# Patient Record
Sex: Female | Born: 1955 | Race: White | Hispanic: No | Smoking: Never smoker
Health system: Southern US, Community
[De-identification: ages and names within clinical notes are randomized; demographics above are authoritative.]

## PROBLEM LIST (undated history)

## (undated) DIAGNOSIS — Z87440 Personal history of urinary (tract) infections: Secondary | ICD-10-CM

## (undated) DIAGNOSIS — Z8619 Personal history of other infectious and parasitic diseases: Secondary | ICD-10-CM

## (undated) DIAGNOSIS — Z8489 Family history of other specified conditions: Secondary | ICD-10-CM

## (undated) DIAGNOSIS — K219 Gastro-esophageal reflux disease without esophagitis: Secondary | ICD-10-CM

## (undated) DIAGNOSIS — E039 Hypothyroidism, unspecified: Secondary | ICD-10-CM

## (undated) DIAGNOSIS — T7840XA Allergy, unspecified, initial encounter: Secondary | ICD-10-CM

## (undated) DIAGNOSIS — M1990 Unspecified osteoarthritis, unspecified site: Secondary | ICD-10-CM

## (undated) DIAGNOSIS — J42 Unspecified chronic bronchitis: Secondary | ICD-10-CM

## (undated) DIAGNOSIS — E079 Disorder of thyroid, unspecified: Secondary | ICD-10-CM

## (undated) HISTORY — DX: Unspecified chronic bronchitis: J42

## (undated) HISTORY — PX: WISDOM TOOTH EXTRACTION: SHX21

## (undated) HISTORY — PX: CATARACT EXTRACTION W/ INTRAOCULAR LENS IMPLANT: SHX1309

## (undated) HISTORY — DX: Personal history of other infectious and parasitic diseases: Z86.19

## (undated) HISTORY — DX: Personal history of urinary (tract) infections: Z87.440

## (undated) HISTORY — DX: Disorder of thyroid, unspecified: E07.9

## (undated) HISTORY — PX: TONSILLECTOMY AND ADENOIDECTOMY: SUR1326

## (undated) HISTORY — DX: Allergy, unspecified, initial encounter: T78.40XA

## (undated) HISTORY — DX: Gastro-esophageal reflux disease without esophagitis: K21.9

---

## 2016-09-29 ENCOUNTER — Ambulatory Visit (INDEPENDENT_AMBULATORY_CARE_PROVIDER_SITE_OTHER): Payer: 59 | Admitting: Physician Assistant

## 2016-09-29 ENCOUNTER — Encounter: Payer: Self-pay | Admitting: Physician Assistant

## 2016-09-29 VITALS — BP 137/88 | HR 60 | Temp 98.3°F | Resp 17 | Ht 68.5 in | Wt 234.0 lb

## 2016-09-29 DIAGNOSIS — J4521 Mild intermittent asthma with (acute) exacerbation: Secondary | ICD-10-CM

## 2016-09-29 MED ORDER — ALBUTEROL SULFATE HFA 108 (90 BASE) MCG/ACT IN AERS
2.0000 | INHALATION_SPRAY | Freq: Four times a day (QID) | RESPIRATORY_TRACT | 0 refills | Status: DC | PRN
Start: 2016-09-29 — End: 2017-03-02

## 2016-09-29 MED ORDER — FLUTICASONE PROPIONATE HFA 44 MCG/ACT IN AERO: 2.0000 | INHALATION_SPRAY | Freq: Two times a day (BID) | RESPIRATORY_TRACT | 0 refills | Status: DC

## 2016-09-29 MED ORDER — PREDNISONE 10 MG PO TABS: ORAL_TABLET | ORAL | 0 refills | Status: AC

## 2016-09-29 NOTE — Patient Instructions (Addendum)
     IF you received an x-ray today, you will receive an invoice from [facility]. Please contact [facility] at [phone] with questions or concerns regarding your invoice.   IF you received labwork today, you will receive an invoice from [facility]. Please contact [facility] at [phone] with questions or concerns regarding your invoice.   Our billing staff will not be able to assist you with questions regarding bills from these companies.  You will be contacted with the lab results as soon as they are available. The fastest way to get your results is to activate your My Chart account. Instructions are located on the last page of this paperwork. If you have not heard from us regarding the results in 2 weeks, please contact this office.    We recommend that you schedule a mammogram for breast cancer screening. Typically, you do not need a referral to do this. Please contact a local imaging center to schedule your mammogram.  [facility] - [phone]  *ask for the Radiology Department [facility]) - [phone] or [phone]  [facility] - [phone] [facility] - [phone] [facility] - [phone]  *ask for the Radiology Department [facility] - [phone]  *ask for the Radiology Department [facility] - [phone]  *ask for the Mammography Department [facility] - [phone] 

## 2016-09-29 NOTE — Progress Notes (Signed)
   Michele Chan  MRN: [id number] DOB: [birth year]  PCP: System, Pcp Not In  Chief Complaint  Patient presents with  . Cough    onset 1 month getting worse  . Shortness of Breath    Subjective:  Pt presents to clinic for problems with SOB and wheezing with a cough for the last month - she has problems with seasonal allergies and she has been using OTC allergy medications.  Most seasons she can take care of it with this regimen but this year she has had problems.  She had an albuterol inhaler from the past that has helped but it ran out.  She is leaving in 6 weeks to go to [place] and wants to make sure she is better by the time she is supposed to leave.  She has no sinus congestion with the use of zyrtec - she has a deep cough that is mainly dry - the production is clear if there is any - she has SOB esp with activity and some wheezing.   Review of Systems  Constitutional: Negative for chills and fever.  HENT: Negative for congestion.   Respiratory: Positive for cough, shortness of breath and wheezing.   Gastrointestinal: Negative.   Allergic/Immunologic: Positive for environmental allergies.    There are no active problems to display for this patient.   No current outpatient prescriptions on file prior to visit.   No current facility-administered medications on file prior to visit.     No Known Allergies  Pt patients past, family and social history were reviewed and updated.   Objective:  BP 137/88 (BP Location: Right Arm, Patient Position: Sitting, Cuff Size: Large)   Pulse 60   Temp 98.3 F (36.8 C) (Oral)   Resp 17   Ht 5' 8.5" (1.74 m)   Wt 234 lb (106.1 kg)   SpO2 98%   BMI 35.06 kg/m   Physical Exam  Constitutional: She is oriented to person, place, and time and well-developed, well-nourished, and in no distress.  HENT:  Head: Normocephalic and atraumatic.  Right Ear: Hearing, tympanic membrane, external ear and ear canal normal.  Left Ear: Hearing, tympanic  membrane, external ear and ear canal normal.  Nose: Nose normal.  Mouth/Throat: Uvula is midline, oropharynx is clear and moist and mucous membranes are normal.  Eyes: Conjunctivae are normal.  Neck: Normal range of motion.  Cardiovascular: Normal rate, regular rhythm and normal heart sounds.   No murmur heard. Pulmonary/Chest: Effort normal. No respiratory distress. She has wheezes (all lung fields - expiratory - worse on the bases). She has no rales.  Neurological: She is alert and oriented to person, place, and time. Gait normal.  Skin: Skin is warm and dry.  Psychiatric: Mood, memory, affect and judgment normal.  Vitals reviewed.   Assessment and Plan :  Mild intermittent asthmatic bronchitis with acute exacerbation - Plan: fluticasone (FLOVENT HFA) 44 MCG/ACT inhaler, albuterol (PROVENTIL HFA;VENTOLIN HFA) 108 (90 Base) MCG/ACT inhaler, predniSONE (DELTASONE) 10 MG tablet, Care order/instruction:   Continue zyrtec - short dose steroids with a month if inhaler steroid - rinse mouth after use - f/u if needed  Windell Hummingbird PA-C  [facility] 09/29/2016 11:06 AM

## 2017-02-26 ENCOUNTER — Encounter: Payer: Self-pay | Admitting: Family Medicine

## 2017-02-26 NOTE — Telephone Encounter (Signed)
Spoke with patient regarding symptoms. Patient reports "wheezing and rattling in my chest", with difficulty breathing. Patient has been using Albuterol inhaler, but it is now empty. Advised patient to go to local Urgent Care for evaluation and treatment, patient verbalized understanding. Patient is scheduled for new patient appointment with Dr. Birdie Riddle on 03/09/17.

## 2017-03-02 ENCOUNTER — Other Ambulatory Visit: Payer: Self-pay | Admitting: Physician Assistant

## 2017-03-02 DIAGNOSIS — J4521 Mild intermittent asthma with (acute) exacerbation: Secondary | ICD-10-CM

## 2017-03-03 ENCOUNTER — Emergency Department (HOSPITAL_BASED_OUTPATIENT_CLINIC_OR_DEPARTMENT_OTHER): Payer: 59

## 2017-03-03 ENCOUNTER — Emergency Department (HOSPITAL_BASED_OUTPATIENT_CLINIC_OR_DEPARTMENT_OTHER)
Admission: EM | Admit: 2017-03-03 | Discharge: 2017-03-03 | Disposition: A | Discharge status: Home / Self Care | Payer: 59 | Attending: Emergency Medicine | Admitting: Emergency Medicine

## 2017-03-03 ENCOUNTER — Encounter (HOSPITAL_BASED_OUTPATIENT_CLINIC_OR_DEPARTMENT_OTHER): Payer: Self-pay | Admitting: Emergency Medicine

## 2017-03-03 DIAGNOSIS — J209 Acute bronchitis, unspecified: Secondary | ICD-10-CM | POA: Diagnosis not present

## 2017-03-03 DIAGNOSIS — R0602 Shortness of breath: Secondary | ICD-10-CM | POA: Diagnosis not present

## 2017-03-03 DIAGNOSIS — Z79899 Other long term (current) drug therapy: Secondary | ICD-10-CM | POA: Insufficient documentation

## 2017-03-03 DIAGNOSIS — R05 Cough: Secondary | ICD-10-CM | POA: Diagnosis present

## 2017-03-03 MED ORDER — PREDNISONE 50 MG PO TABS
60.0000 mg | ORAL_TABLET | Freq: Once | ORAL | Status: AC
  Administered 2017-03-03: 22:00:00 60 mg via ORAL
  Filled 2017-03-03: qty 1

## 2017-03-03 MED ORDER — IPRATROPIUM BROMIDE 0.02 % IN SOLN
0.5000 mg | Freq: Once | RESPIRATORY_TRACT | Status: AC
  Administered 2017-03-03: 0.5 mg via RESPIRATORY_TRACT
  Filled 2017-03-03: qty 2.5

## 2017-03-03 MED ORDER — ALBUTEROL SULFATE (2.5 MG/3ML) 0.083% IN NEBU
5.0000 mg | INHALATION_SOLUTION | Freq: Once | RESPIRATORY_TRACT | Status: AC
  Administered 2017-03-03: 5 mg via RESPIRATORY_TRACT
  Filled 2017-03-03: qty 6

## 2017-03-03 MED ORDER — PREDNISONE 20 MG PO TABS: ORAL_TABLET | ORAL | 0 refills | Status: DC

## 2017-03-03 MED ORDER — ALBUTEROL SULFATE (2.5 MG/3ML) 0.083% IN NEBU
2.5000 mg | INHALATION_SOLUTION | Freq: Once | RESPIRATORY_TRACT | Status: AC
  Administered 2017-03-03: 2.5 mg via RESPIRATORY_TRACT
  Filled 2017-03-03: qty 3

## 2017-03-03 MED ORDER — ALBUTEROL SULFATE HFA 108 (90 BASE) MCG/ACT IN AERS
2.0000 | INHALATION_SPRAY | Freq: Once | RESPIRATORY_TRACT | Status: AC
  Administered 2017-03-03: 2 via RESPIRATORY_TRACT
  Filled 2017-03-03: qty 6.7

## 2017-03-03 MED ORDER — IPRATROPIUM-ALBUTEROL 0.5-2.5 (3) MG/3ML IN SOLN
3.0000 mL | Freq: Once | RESPIRATORY_TRACT | Status: AC
Start: 2017-03-03 — End: 2017-03-03
  Administered 2017-03-03: 3 mL via RESPIRATORY_TRACT
  Filled 2017-03-03: qty 3

## 2017-03-03 NOTE — ED Provider Notes (Signed)
[facility] DEPT [facility] Provider Note   CSN: [id number] Arrival date & time: 03/03/17  2426     History   Chief Complaint Chief Complaint  Patient presents with  . Cough    HPI Michele Chan is a 61 y.o. female.  HPI  61 year old female presents with cough, shortness of breath, and chest tightness. She's been having cough for at least 2 weeks. She's been having wheezing and cough and shortness of breath on and off since May. This is a new thing for her. No prior history of smoking or asthma. She does have a history of allergies. Tonight at dinner the cough and short of breath seemed to be rapidly worsening. There was audible wheezing. She has albuterol inhaler but is almost out. The chest tightness has improved after being given 2 breathing treatments by respiratory prior to be seeing her. No fevers. No leg swelling.  Past Medical History:  Diagnosis Date  . Allergy   . GERD (gastroesophageal reflux disease)     There are no active problems to display for this patient.   History reviewed. No pertinent surgical history.  OB History    No data available       Home Medications    Prior to Admission medications   Medication Sig Start Date End Date Taking? Authorizing Provider  albuterol (PROVENTIL HFA;VENTOLIN HFA) 108 (90 Base) MCG/ACT inhaler Inhale 2 puffs into the lungs every 6 (six) hours as needed for wheezing or shortness of breath. 09/29/16   Weber, Damaris Hippo, PA-C  cetirizine (ZYRTEC) 10 MG tablet Take 10 mg by mouth daily.    [provider]  fluticasone (FLOVENT HFA) 44 MCG/ACT inhaler Inhale 2 puffs into the lungs 2 (two) times daily. 09/29/16   Weber, Damaris Hippo, PA-C  omeprazole (PRILOSEC) 10 MG capsule Take 10 mg by mouth daily.    [provider]  predniSONE (DELTASONE) 20 MG tablet 2 tabs po daily x 4 days 03/03/17   Sherwood Gambler, MD    Family History Family History  Problem Relation Age of Onset  . Cancer Mother   . Hypertension  Mother   . Hyperlipidemia Brother   . Cancer Maternal Grandmother   . Alzheimer's disease Paternal Grandmother   . Cancer Paternal Grandfather   . Heart disease Paternal Grandfather   . Hyperlipidemia Paternal Grandfather     Social History Social History  Substance Use Topics  . Smoking status: Never Smoker  . Smokeless tobacco: Never Used  . Alcohol use 1.8 oz/week    3 Standard drinks or equivalent per week     Allergies   Patient has no known allergies.   Review of Systems Review of Systems  Constitutional: Negative for fever.  Respiratory: Positive for cough, chest tightness, shortness of breath and wheezing.   Cardiovascular: Negative for chest pain and leg swelling.  All other systems reviewed and are negative.    Physical Exam Updated Vital Signs BP (!) 120/57   Pulse 88   Temp 98.3 F (36.8 C) (Oral)   Resp (!) 22   Ht 5\' 8"  (1.727 m)   Wt 106.1 kg (234 lb)   SpO2 96%   BMI 35.58 kg/m   Physical Exam  Constitutional: She is oriented to person, place, and time. She appears well-developed and well-nourished. No distress.  HENT:  Head: Normocephalic and atraumatic.  Right Ear: External ear normal.  Left Ear: External ear normal.  Nose: Nose normal.  Eyes: Right eye exhibits no discharge. Left eye  exhibits no discharge.  Cardiovascular: Normal rate, regular rhythm and normal heart sounds.   Pulmonary/Chest: Effort normal. No tachypnea. No respiratory distress. She has wheezes (diffuse, expiratory). She exhibits no tenderness.  Abdominal: Soft. There is no tenderness.  Musculoskeletal: She exhibits no edema.  Neurological: She is alert and oriented to person, place, and time.  Skin: Skin is warm and dry. She is not diaphoretic.  Nursing note and vitals reviewed.    ED Treatments / Results  Labs (all labs ordered are listed, but only abnormal results are displayed) Labs Reviewed - No data to display  EKG  EKG  Interpretation  Date/Time:  Saturday March 03 2017 19:30:12 EDT Ventricular Rate:  80 PR Interval:    QRS Duration: 68 QT Interval:  358 QTC Calculation: 413 R Axis:   62 Text Interpretation:  Sinus rhythm Nonspecific T abnrm, anterolateral leads No old tracing to compare Confirmed by Sherwood Gambler [phone]) on 03/03/2017 8:27:54 PM       Radiology Dg Chest 2 View  Result Date: 03/03/2017 CLINICAL DATA:  Substernal pressure.  Cough for 1 week. EXAM: CHEST  2 VIEW COMPARISON:  None. FINDINGS: The lungs are clear. The pulmonary vasculature is normal. Heart size is normal. Hilar and mediastinal contours are unremarkable. There is no pleural effusion. IMPRESSION: No active cardiopulmonary disease. Electronically Signed   By: Andreas Newport M.D.   On: 03/03/2017 21:43    Procedures Procedures (including critical care time)  Medications Ordered in ED Medications  albuterol (PROVENTIL) (2.5 MG/3ML) 0.083% nebulizer solution 2.5 mg (2.5 mg Nebulization Given 03/03/17 1934)  ipratropium-albuterol (DUONEB) 0.5-2.5 (3) MG/3ML nebulizer solution 3 mL (3 mLs Nebulization Given 03/03/17 1934)  albuterol (PROVENTIL) (2.5 MG/3ML) 0.083% nebulizer solution 2.5 mg (2.5 mg Nebulization Given 03/03/17 2040)  albuterol (PROVENTIL) (2.5 MG/3ML) 0.083% nebulizer solution 5 mg (5 mg Nebulization Given 03/03/17 2127)  ipratropium (ATROVENT) nebulizer solution 0.5 mg (0.5 mg Nebulization Given 03/03/17 2127)  predniSONE (DELTASONE) tablet 60 mg (60 mg Oral Given 03/03/17 2213)  albuterol (PROVENTIL HFA;VENTOLIN HFA) 108 (90 Base) MCG/ACT inhaler 2 puff (2 puffs Inhalation Given 03/03/17 2229)     Initial Impression / Assessment and Plan / ED Course  I have reviewed the triage vital signs and the nursing notes.  Pertinent labs & imaging results that were available during my care of the patient were reviewed by me and considered in my medical decision making (see chart for details).     Patient  wheezing and shortness of breath has improved. I think her chest tightness is related to the wheezing and highly doubt ACS, PE, dissection. Unclear why she is having wheezing and these shortness breath symptoms for several months now. I have recommended she follow-up with pulmonology. Given this appears to be a subacute/chronic problem I will treat her with steroids for her worsening symptoms today. She was given an albuterol inhaler. Discussed strict return precautions.  Final Clinical Impressions(s) / ED Diagnoses   Final diagnoses:  Acute bronchitis, unspecified organism    New Prescriptions Discharge Medication List as of 03/03/2017 10:27 PM    START taking these medications   Details  predniSONE (DELTASONE) 20 MG tablet 2 tabs po daily x 4 days, Print         Sherwood Gambler, MD 03/04/17 0011

## 2017-03-03 NOTE — ED Notes (Signed)
Pt given d/c instructions as per chart. Rx x 1. Verbalizes understanding. No questions. 

## 2017-03-03 NOTE — ED Triage Notes (Signed)
Patient has had a course cough for a few days. PAtient was at dinner tonight and started to cough so back that she couldn't breath at all.

## 2017-03-09 ENCOUNTER — Ambulatory Visit (INDEPENDENT_AMBULATORY_CARE_PROVIDER_SITE_OTHER): Payer: 59 | Admitting: Family Medicine

## 2017-03-09 ENCOUNTER — Encounter: Payer: Self-pay | Admitting: Family Medicine

## 2017-03-09 VITALS — BP 126/84 | HR 79 | Temp 98.1°F | Resp 18 | Ht 68.0 in | Wt 227.1 lb

## 2017-03-09 DIAGNOSIS — R05 Cough: Secondary | ICD-10-CM | POA: Diagnosis not present

## 2017-03-09 DIAGNOSIS — R053 Chronic cough: Secondary | ICD-10-CM

## 2017-03-09 MED ORDER — IPRATROPIUM-ALBUTEROL 0.5-2.5 (3) MG/3ML IN SOLN
3.0000 mL | Freq: Once | RESPIRATORY_TRACT | Status: AC
  Administered 2017-03-09: 3 mL via RESPIRATORY_TRACT

## 2017-03-09 MED ORDER — BUDESONIDE-FORMOTEROL FUMARATE 80-4.5 MCG/ACT IN AERO: 2.0000 | INHALATION_SPRAY | Freq: Two times a day (BID) | RESPIRATORY_TRACT | 3 refills | Status: DC

## 2017-03-09 MED ORDER — ALBUTEROL SULFATE HFA 108 (90 BASE) MCG/ACT IN AERS: 2.0000 | INHALATION_SPRAY | RESPIRATORY_TRACT | 3 refills | Status: DC | PRN

## 2017-03-09 NOTE — Assessment & Plan Note (Signed)
New to provider, ongoing for pt.  She was treated for acute asthmatic bronchitis in May but prior to that, she had not required an inhaler for 'years'.  But since, she has struggled w/ chronic cough and SOB, ending up in ER on 10/13.  Today, she has wheezes and deep hacking cough- both improved s/p DuoNeb.  Start Symbicort twice daily, albuterol prn and refer to pulmonary for complete assessment.  Recent CXR reviewed and WNL.  Reviewed supportive care and red flags that should prompt return.  Pt expressed understanding and is in agreement w/ plan.

## 2017-03-09 NOTE — Addendum Note (Signed)
Addended by: Davis Gourd on: 03/09/2017 03:18 PM   Modules accepted: Orders

## 2017-03-09 NOTE — Patient Instructions (Signed)
Schedule your complete physical in 3 months We'll call you with your pulmonary appt Start the Symbicort- 2 puffs twice daily- every day! Continue to use the Albuterol as needed for cough or shortness of breath Call with any questions or concerns Hang in there!  We'll get this better!!!

## 2017-03-09 NOTE — Progress Notes (Signed)
   Subjective:    Patient ID: Michele Chan, female    DOB: Nov 29, 1955, 61 y.o.   MRN: [id number]  HPI New to establish.  Previous provider- none recently (over 6 yrs)  Cough- pt reports she has had a cough since Feb (was seen at [facility] in May and dx'd w/ Bronchitis) She was treated w/ steroids, albuterol, abx and things improved.  Ended up in ER on 10/13 w/ cough and shortness of breath.  Pt has hx of childhood asthma, no personal smoking hx but father was heavy smoker.  Pt was sent home from ER w/ Proventil inhaler w/ a spacer and is requiring this multiple times daily- 'particularly at night'.     Review of Systems For ROS see HPI     Objective:   Physical Exam  Constitutional: She is oriented to person, place, and time. She appears well-developed and well-nourished. No distress.  HENT:  Head: Normocephalic and atraumatic.  TMs normal bilaterally Mild nasal congestion Throat w/out erythema, edema, or exudate  Eyes: Pupils are equal, round, and reactive to light. Conjunctivae and EOM are normal.  Neck: Normal range of motion. Neck supple.  Cardiovascular: Normal rate, regular rhythm, normal heart sounds and intact distal pulses.   No murmur heard. Pulmonary/Chest: Effort normal. No respiratory distress. She has wheezes (scattered expiratory wheezes- improved s/p neb tx).  + hacking cough  Lymphadenopathy:    She has no cervical adenopathy.  Neurological: She is alert and oriented to person, place, and time.  Skin: Skin is warm and dry.  Psychiatric: She has a normal mood and affect. Her behavior is normal. Thought content normal.  Vitals reviewed.         Assessment & Plan:

## 2017-04-24 ENCOUNTER — Encounter: Payer: Self-pay | Admitting: Internal Medicine

## 2017-04-24 ENCOUNTER — Other Ambulatory Visit (INDEPENDENT_AMBULATORY_CARE_PROVIDER_SITE_OTHER): Payer: 59

## 2017-04-24 ENCOUNTER — Ambulatory Visit (INDEPENDENT_AMBULATORY_CARE_PROVIDER_SITE_OTHER): Payer: 59 | Admitting: Internal Medicine

## 2017-04-24 VITALS — BP 120/74 | HR 60 | Ht 69.0 in | Wt 226.6 lb

## 2017-04-24 DIAGNOSIS — J4540 Moderate persistent asthma, uncomplicated: Secondary | ICD-10-CM

## 2017-04-24 DIAGNOSIS — Z23 Encounter for immunization: Secondary | ICD-10-CM | POA: Diagnosis not present

## 2017-04-24 LAB — CBC WITH DIFFERENTIAL/PLATELET
BASOS ABS: 0.1 10*3/uL (ref 0.0–0.1)
Basophils Relative: 0.9 % (ref 0.0–3.0)
Eosinophils Absolute: 0.5 10*3/uL (ref 0.0–0.7)
Eosinophils Relative: 6.2 % — ABNORMAL HIGH (ref 0.0–5.0)
HEMATOCRIT: 41.5 % (ref 36.0–46.0)
HEMOGLOBIN: 14 g/dL (ref 12.0–15.0)
LYMPHS PCT: 33.4 % (ref 12.0–46.0)
Lymphs Abs: 2.6 10*3/uL (ref 0.7–4.0)
MCHC: 33.8 g/dL (ref 30.0–36.0)
MCV: 92.3 fl (ref 78.0–100.0)
MONOS PCT: 8.4 % (ref 3.0–12.0)
Monocytes Absolute: 0.7 10*3/uL (ref 0.1–1.0)
NEUTROS ABS: 4 10*3/uL (ref 1.4–7.7)
Neutrophils Relative %: 51.1 % (ref 43.0–77.0)
PLATELETS: 233 10*3/uL (ref 150.0–400.0)
RBC: 4.5 Mil/uL (ref 3.87–5.11)
RDW: 13.6 % (ref 11.5–15.5)
WBC: 7.9 10*3/uL (ref 4.0–10.5)

## 2017-04-24 LAB — NITRIC OXIDE: Nitric Oxide: 35

## 2017-04-24 NOTE — Patient Instructions (Signed)
ICD-10-CM   1. Asthma, well controlled, moderate persistent J45.40     Diagnosis - high pre test probability is asthma  Plan  - continue symbicort 2 puf twice daily ; rinse mouth always - albuterol as needed - check IgE, cbc with diff and blood allergy panel 04/24/2017 - flu shot 04/24/2017 - Pneumovax vaccine after few weeks anytime - can do next visit too - do spirometry with bronchodilator and dlco in 3 months  Followup  - in 3 months with Dr Chase Caller but after spirometry

## 2017-04-24 NOTE — Progress Notes (Signed)
Subjective:    Patient ID: Michele Chan, female    DOB: [birth year], 61 y.o.   MRN: [id number]  PCP Midge Minium, MD  HPI  IOV 04/24/2017   Chief Complaint  Patient presents with  . Advice Only    Referred by Dr. Urbano Heir. States that she had bad congestion over the spring and was put on a steroid inhaler which helped. Went on a trip and congestion came back, ended up in ER late September 2018 due to unable to breathe and was put on prednisone. Pt was put on Symbicort which helped with symptoms.    61 year old female.  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  She has been referred for presumed asthma based on the fact she is on Symbicort.  According to her history every year she gets winter bronchitis and she has mild seasonal fall allergies but otherwise she has been healthy.  In spring 2018 started having chest congestion and wheezing and was given empiric inhaled steroid for a month and she began to feel completely better.  This was then stopped.  She then went on a trip to [place] for 30 days and was doing well but upon return of her symptoms started slowly coming back and started deteriorating so much so that in fall 2018 she ended up in the emergency room for significant exacerbation of wheezing.  She was given prednisone that helped significantly.  She followed up with her primary care physician and was placed on Symbicort and since then she has had dramatic improvement in her symptoms.  She did have a chest x-ray in the emergency room that I personally visualized and is clear.   Currently on Symbicort: feno 35 ppbb - on symbicort (improved symptoms) and is in the gray zone.  Asthma control questionnaire shows that she is not waking up in the middle of the night because of any symptoms.  When she wakes up she has very mild symptoms that resolved with Symbicort.  She is not limited in her activities because of her symptoms.  She does experience very little shortness of breath  and wheezing early in the morning that resolves with Symbicort.  She is not using albuterol for rescue.  Asthma control five-point questionnaire is 0.6 showing good control.  Asthma relevant history includes mom with asthma.  She only has occasional mold or mildew exposure while working as a Cabin crew.  There is no cockroach exposure or cat exposure.  Her house is a wooden floor.  She is not a smoker.  She is not on ACE inhibitors.  She has not had a flu shot this season yet   Asthma Control Panel 04/24/2017   Current Med Regimen symbicort dose not known  ACQ 5 point- 1 week. wtd avg score. <1.0 is good control 0.75-1.25 is grey zone. >1.25 poor control. Delta 0.5 is clinically meaningful   FeNO ppB 35 ppb  FeV1    Planned intervention  for visit Symbicort, blood work       has a past medical history of Allergy, Chronic bronchitis ([place]), GERD (gastroesophageal reflux disease), History of shingles, and History of UTI.   reports that  has never smoked. she has never used smokeless tobacco.  Past Surgical History:  Procedure Laterality Date  . TONSILLECTOMY AND ADENOIDECTOMY      No Known Allergies   There is no immunization history on file for this patient.  Family History  Problem Relation Age of Onset  . Cancer Mother   .  Hypertension Mother   . Asthma Mother   . Miscarriages / [place] Mother   . Emphysema Father   . Early death Father   . Hyperlipidemia Brother   . Cancer Maternal Grandmother   . Alzheimer's disease Paternal Grandmother   . Cancer Paternal Grandfather   . Heart disease Paternal Grandfather   . Hyperlipidemia Paternal Grandfather   . Diabetes Paternal Grandfather      Current Outpatient Medications:  .  albuterol (PROAIR HFA) 108 (90 Base) MCG/ACT inhaler, Inhale 2 puffs into the lungs every 4 (four) hours as needed for wheezing or shortness of breath., Disp: 1 Inhaler, Rfl: 3 .  budesonide-formoterol (SYMBICORT) 80-4.5 MCG/ACT inhaler, Inhale 2  puffs into the lungs 2 (two) times daily., Disp: 1 Inhaler, Rfl: 3 .  cetirizine (ZYRTEC) 10 MG tablet, Take 10 mg by mouth daily., Disp: , Rfl:  .  Ibuprofen (ADVIL PO), Take by mouth., Disp: , Rfl:  .  omeprazole (PRILOSEC) 10 MG capsule, Take 10 mg by mouth daily., Disp: , Rfl:    Review of Systems  Constitutional: Negative for fever and unexpected weight change.  HENT: Positive for postnasal drip and sneezing. Negative for congestion, dental problem, ear pain, nosebleeds, rhinorrhea, sinus pressure, sore throat and trouble swallowing.   Eyes: Negative for redness and itching.  Respiratory: Positive for cough, shortness of breath and wheezing. Negative for chest tightness.   Cardiovascular: Negative for palpitations and leg swelling.  Gastrointestinal: Negative for nausea and vomiting.  Genitourinary: Negative for dysuria.  Musculoskeletal: Negative for joint swelling.  Skin: Negative for rash.  Allergic/Immunologic: Positive for environmental allergies. Negative for food allergies and immunocompromised state.  Neurological: Negative for headaches.  Hematological: Does not bruise/bleed easily.  Psychiatric/Behavioral: Negative for dysphoric mood. The patient is not nervous/anxious.        Objective:   Physical Exam  Constitutional: She is oriented to person, place, and time. She appears well-developed and well-nourished. No distress.  HENT:  Head: Normocephalic and atraumatic.  Right Ear: External ear normal.  Left Ear: External ear normal.  Mouth/Throat: Oropharynx is clear and moist. No oropharyngeal exudate.  Eyes: Conjunctivae and EOM are normal. Pupils are equal, round, and reactive to light. Right eye exhibits no discharge. Left eye exhibits no discharge. No scleral icterus.  Neck: Normal range of motion. Neck supple. No JVD present. No tracheal deviation present. No thyromegaly present.  Cardiovascular: Normal rate, regular rhythm, normal heart sounds and intact distal  pulses. Exam reveals no gallop and no friction rub.  No murmur heard. Pulmonary/Chest: Effort normal and breath sounds normal. No respiratory distress. She has no wheezes. She has no rales. She exhibits no tenderness.  Abdominal: Soft. Bowel sounds are normal. She exhibits no distension and no mass. There is no tenderness. There is no rebound and no guarding.  Musculoskeletal: Normal range of motion. She exhibits no edema or tenderness.  Lymphadenopathy:    She has no cervical adenopathy.  Neurological: She is alert and oriented to person, place, and time. She has normal reflexes. No cranial nerve deficit. She exhibits normal muscle tone. Coordination normal.  Skin: Skin is warm and dry. No rash noted. She is not diaphoretic. No erythema. No pallor.  Psychiatric: She has a normal mood and affect. Her behavior is normal. Judgment and thought content normal.  Vitals reviewed.   Vitals:   04/24/17 1000  BP: 120/74  Pulse: 60  SpO2: 98%  Weight: 226 lb 9.6 oz (102.8 kg)  Height: 5\' 9"  (1.753  m)    Estimated body mass index is 33.46 kg/m as calculated from the following:   Height as of this encounter: 5\' 9"  (1.753 m).   Weight as of this encounter: 226 lb 9.6 oz (102.8 kg).       Assessment & Plan:     ICD-10-CM   1. Asthma, well controlled, moderate persistent J45.40     Diagnosis - high pre test probability is asthma  Plan  - continue symbicort 2 puf twice daily ; rinse mouth always - discussed long term need - albuterol as needed - check IgE, cbc with diff and blood allergy panel 04/24/2017 - affirm allergy role and future decisions on biologic x - flu shot 04/24/2017 - Pneumovax vaccine after few weeks anytime - can do next visit too - do spirometry with bronchodilator and dlco in 3 months  Followup  - in 3 months with Dr Chase Caller but after spirometry    Dr. Brand Males, M.D., [facility].C.P Pulmonary and Critical Care Medicine Staff Physician, [place] Director - Interstitial Lung Disease  Program  Pulmonary [place] at [place], [place], [address]  Pager: [phone], If no answer or between  15:00h - 7:00h: call [phone] Telephone: [phone]

## 2017-04-25 LAB — RESPIRATORY ALLERGY PROFILE REGION II ~~LOC~~
Allergen, A. alternata, m6: 1.33 kU/L — ABNORMAL HIGH
Allergen, Cottonwood, t14: <0.1 kU/L
Allergen, D pternoyssinus,d7: 0.14 kU/L — ABNORMAL HIGH
Allergen, P. notatum, m1: <0.1 kU/L
Aspergillus fumigatus, m3: <0.1 kU/L
Bermuda Grass: 0.1 kU/L — ABNORMAL HIGH
Box Elder IgE: 0.14 kU/L — ABNORMAL HIGH
CAT DANDER: 0.6 kU/L — AB
CLADOSPORIUM HERBARUM (M2) IGE: <0.1 kU/L
CLASS: 0
CLASS: 0
CLASS: 0
CLASS: 0
CLASS: 0
CLASS: 0
CLASS: 0
CLASS: 0
CLASS: 0
CLASS: 0
CLASS: 0
CLASS: 0
CLASS: 0
CLASS: 1
CLASS: 2
CLASS: 2
COMMON RAGWEED (SHORT) (W1) IGE: 0.94 kU/L — AB
Class: 0
Class: 0
Class: 0
Class: 0
Class: 0
Class: 0
Class: 2
Class: 3
D. farinae: 0.17 kU/L — ABNORMAL HIGH
Dog Dander: 5.37 kU/L — ABNORMAL HIGH
Elm IgE: <0.1 kU/L
IGE (IMMUNOGLOBULIN E), SERUM: 241 kU/L — AB (ref <=114)
Johnson Grass: <0.1 kU/L
Timothy Grass: 1.48 kU/L — ABNORMAL HIGH

## 2017-04-25 LAB — INTERPRETATION:

## 2017-04-26 ENCOUNTER — Telehealth: Payer: Self-pay | Admitting: Internal Medicine

## 2017-04-26 NOTE — Telephone Encounter (Signed)
Let Michele Chan know that eos high, igE high, blood allergy - several positive. Dx fits in well with asthma. For now continue symbicort. She could see an allergist ([facility] - Dr Remus Blake [facility]) if she wants but we can monitor too  Dr. Brand Males, M.D., [facility].C.P Pulmonary and Critical Care Medicine Staff Physician, [place] Director - Interstitial Lung Disease  Program  Pulmonary [place] at [place], [place], [address]  Pager: [phone], If no answer or between  15:00h - 7:00h: call [phone] Telephone: [phone]

## 2017-04-26 NOTE — Telephone Encounter (Signed)
Called pt and advised message from the provider. Pt understood and verbalized understanding. Nothing further is needed.    

## 2017-06-19 ENCOUNTER — Ambulatory Visit (INDEPENDENT_AMBULATORY_CARE_PROVIDER_SITE_OTHER): Payer: 59 | Admitting: Family Medicine

## 2017-06-19 ENCOUNTER — Other Ambulatory Visit: Payer: Self-pay

## 2017-06-19 ENCOUNTER — Encounter: Payer: Self-pay | Admitting: Family Medicine

## 2017-06-19 VITALS — BP 122/76 | HR 63 | Temp 98.1°F | Resp 16 | Ht 69.0 in | Wt 225.0 lb

## 2017-06-19 DIAGNOSIS — Z1239 Encounter for other screening for malignant neoplasm of breast: Secondary | ICD-10-CM

## 2017-06-19 DIAGNOSIS — Z1231 Encounter for screening mammogram for malignant neoplasm of breast: Secondary | ICD-10-CM | POA: Diagnosis not present

## 2017-06-19 DIAGNOSIS — E669 Obesity, unspecified: Secondary | ICD-10-CM

## 2017-06-19 DIAGNOSIS — Z0000 Encounter for general adult medical examination without abnormal findings: Secondary | ICD-10-CM | POA: Diagnosis not present

## 2017-06-19 DIAGNOSIS — Z23 Encounter for immunization: Secondary | ICD-10-CM

## 2017-06-19 LAB — CBC WITH DIFFERENTIAL/PLATELET
BASOS PCT: 0.6 % (ref 0.0–3.0)
Basophils Absolute: 0 10*3/uL (ref 0.0–0.1)
EOS PCT: 5.5 % — AB (ref 0.0–5.0)
Eosinophils Absolute: 0.4 10*3/uL (ref 0.0–0.7)
HEMATOCRIT: 40.1 % (ref 36.0–46.0)
Hemoglobin: 13.5 g/dL (ref 12.0–15.0)
LYMPHS PCT: 29.3 % (ref 12.0–46.0)
Lymphs Abs: 2.1 10*3/uL (ref 0.7–4.0)
MCHC: 33.8 g/dL (ref 30.0–36.0)
MCV: 92.1 fl (ref 78.0–100.0)
MONOS PCT: 6.9 % (ref 3.0–12.0)
Monocytes Absolute: 0.5 10*3/uL (ref 0.1–1.0)
NEUTROS ABS: 4.1 10*3/uL (ref 1.4–7.7)
Neutrophils Relative %: 57.7 % (ref 43.0–77.0)
PLATELETS: 243 10*3/uL (ref 150.0–400.0)
RBC: 4.36 Mil/uL (ref 3.87–5.11)
RDW: 13.8 % (ref 11.5–15.5)
WBC: 7.1 10*3/uL (ref 4.0–10.5)

## 2017-06-19 LAB — LIPID PANEL
Cholesterol: 246 mg/dL — ABNORMAL HIGH (ref 0–200)
HDL: 62.5 mg/dL (ref >39.00)
LDL CALC: 168 mg/dL — AB (ref 0–99)
NONHDL: 183.96
Total CHOL/HDL Ratio: 4
Triglycerides: 80 mg/dL (ref 0.0–149.0)
VLDL: 16 mg/dL (ref 0.0–40.0)

## 2017-06-19 LAB — HEPATIC FUNCTION PANEL
ALT: 16 U/L (ref 0–35)
AST: 16 U/L (ref 0–37)
Albumin: 4.4 g/dL (ref 3.5–5.2)
Alkaline Phosphatase: 84 U/L (ref 39–117)
BILIRUBIN TOTAL: 0.6 mg/dL (ref 0.2–1.2)
Bilirubin, Direct: 0.1 mg/dL (ref 0.0–0.3)
Total Protein: 7.6 g/dL (ref 6.0–8.3)

## 2017-06-19 LAB — BASIC METABOLIC PANEL
BUN: 20 mg/dL (ref 6–23)
CALCIUM: 9.8 mg/dL (ref 8.4–10.5)
CO2: 29 mEq/L (ref 19–32)
Chloride: 100 mEq/L (ref 96–112)
Creatinine, Ser: 0.83 mg/dL (ref 0.40–1.20)
GFR: 74.32 mL/min (ref >60.00)
Glucose, Bld: 90 mg/dL (ref 70–99)
Potassium: 4.2 mEq/L (ref 3.5–5.1)
Sodium: 138 mEq/L (ref 135–145)

## 2017-06-19 LAB — TSH: TSH: 9.76 u[IU]/mL — ABNORMAL HIGH (ref 0.35–4.50)

## 2017-06-19 NOTE — Patient Instructions (Signed)
Follow up in 1 year or as needed We'll notify you of your lab results and make any changes if needed Continue to work on healthy diet and regular exercise- you're doing great! [facility]- [facility]! Complete the cologuard as directed when it arrives Call with any questions or concerns Happy New Year!!!

## 2017-06-19 NOTE — Assessment & Plan Note (Signed)
Pt is now committed to weight loss and got a puppy to help her be more active.  Applauded her efforts.  Will check labs to risk stratify.  Will follow.

## 2017-06-19 NOTE — Progress Notes (Signed)
   Subjective:    Patient ID: Michele Chan, female    DOB: 10-Sep-1955, 62 y.o.   MRN: [id number]  HPI CPE- pt is due for mammo, declines colonoscopy but is open to Cologuard.  Will get Tdap today.  Due for pap smear.  Pt has increased her physical activity by getting a puppy.   Review of Systems Patient reports no vision/ hearing changes, adenopathy,fever, weight change,  persistant/recurrent hoarseness , swallowing issues, chest pain, palpitations, edema, persistant/recurrent cough, hemoptysis, dyspnea (rest/exertional/paroxysmal nocturnal), gastrointestinal bleeding (melena, rectal bleeding), abdominal pain, significant heartburn, bowel changes, GU symptoms (dysuria, hematuria, incontinence), Gyn symptoms (abnormal  bleeding, pain),  syncope, focal weakness, memory loss, numbness & tingling, skin/hair/nail changes, abnormal bruising or bleeding, anxiety, or depression.     Objective:   Physical Exam General Appearance:    Alert, cooperative, no distress, appears stated age  Head:    Normocephalic, without obvious abnormality, atraumatic  Eyes:    PERRL, conjunctiva/corneas clear, EOM's intact, fundi    benign, both eyes  Ears:    Normal TM's and external ear canals, both ears  Nose:   Nares normal, septum midline, mucosa normal, no drainage    or sinus tenderness  Throat:   Lips, mucosa, and tongue normal; teeth and gums normal  Neck:   Supple, symmetrical, trachea midline, no adenopathy;    Thyroid: no enlargement/tenderness/nodules  Back:     Symmetric, no curvature, ROM normal, no CVA tenderness  Lungs:     Clear to auscultation bilaterally, respirations unlabored  Chest Wall:    No tenderness or deformity   Heart:    Regular rate and rhythm, S1 and S2 normal, no murmur, rub   or gallop  Breast Exam:    Deferred to mammo  Abdomen:     Soft, non-tender, bowel sounds active all four quadrants,    no masses, no organomegaly  Genitalia:    Deferred at pt's request  Rectal:      Extremities:   Extremities normal, atraumatic, no cyanosis or edema  Pulses:   2+ and symmetric all extremities  Skin:   Skin color, texture, turgor normal, no rashes or lesions  Lymph nodes:   Cervical, supraclavicular, and axillary nodes normal  Neurologic:   CNII-XII intact, normal strength, sensation and reflexes    throughout          Assessment & Plan:

## 2017-06-19 NOTE — Assessment & Plan Note (Signed)
Pt's PE WNL w/ exception of obesity.  Due for mammo- ordered.  She declines pap at this time.  Tdap given.  Check labs.  Anticipatory guidance provided.

## 2017-06-20 ENCOUNTER — Other Ambulatory Visit: Payer: Self-pay | Admitting: General Practice

## 2017-06-20 ENCOUNTER — Encounter: Payer: Self-pay | Admitting: General Practice

## 2017-06-20 DIAGNOSIS — E039 Hypothyroidism, unspecified: Secondary | ICD-10-CM

## 2017-06-20 MED ORDER — LEVOTHYROXINE SODIUM 75 MCG PO TABS: 75.0000 ug | ORAL_TABLET | Freq: Every day | ORAL | 3 refills | Status: DC

## 2017-07-03 LAB — COLOGUARD: Cologuard: POSITIVE

## 2017-07-10 ENCOUNTER — Ambulatory Visit
Admission: RE | Admit: 2017-07-10 | Discharge: 2017-07-10 | Disposition: A | Discharge status: Home / Self Care | Payer: 59 | Source: Ambulatory Visit | Attending: Family Medicine | Admitting: Family Medicine

## 2017-07-10 DIAGNOSIS — Z1239 Encounter for other screening for malignant neoplasm of breast: Secondary | ICD-10-CM

## 2017-07-11 ENCOUNTER — Other Ambulatory Visit: Payer: Self-pay | Admitting: Family Medicine

## 2017-07-11 DIAGNOSIS — R928 Other abnormal and inconclusive findings on diagnostic imaging of breast: Secondary | ICD-10-CM

## 2017-07-13 ENCOUNTER — Other Ambulatory Visit: Payer: Self-pay | Admitting: Family Medicine

## 2017-07-13 ENCOUNTER — Telehealth: Payer: Self-pay | Admitting: General Practice

## 2017-07-13 ENCOUNTER — Encounter: Payer: Self-pay | Admitting: Gastroenterology

## 2017-07-13 DIAGNOSIS — R195 Other fecal abnormalities: Secondary | ICD-10-CM

## 2017-07-13 NOTE — Telephone Encounter (Signed)
Office received positive cologuard. Pt informed and referral to GI placed per PCP.

## 2017-07-16 ENCOUNTER — Other Ambulatory Visit (INDEPENDENT_AMBULATORY_CARE_PROVIDER_SITE_OTHER): Payer: 59

## 2017-07-16 ENCOUNTER — Ambulatory Visit
Admission: RE | Admit: 2017-07-16 | Discharge: 2017-07-16 | Disposition: A | Discharge status: Home / Self Care | Payer: 59 | Source: Ambulatory Visit | Attending: Family Medicine | Admitting: Family Medicine

## 2017-07-16 ENCOUNTER — Ambulatory Visit: Payer: 59

## 2017-07-16 DIAGNOSIS — E039 Hypothyroidism, unspecified: Secondary | ICD-10-CM | POA: Diagnosis not present

## 2017-07-16 DIAGNOSIS — R928 Other abnormal and inconclusive findings on diagnostic imaging of breast: Secondary | ICD-10-CM

## 2017-07-16 LAB — TSH: TSH: 4.88 u[IU]/mL — ABNORMAL HIGH (ref 0.35–4.50)

## 2017-07-17 ENCOUNTER — Other Ambulatory Visit: Payer: Self-pay | Admitting: General Practice

## 2017-07-17 DIAGNOSIS — E039 Hypothyroidism, unspecified: Secondary | ICD-10-CM

## 2017-07-17 MED ORDER — LEVOTHYROXINE SODIUM 88 MCG PO TABS: 88.0000 ug | ORAL_TABLET | Freq: Every day | ORAL | 3 refills | Status: DC

## 2017-08-13 ENCOUNTER — Encounter: Payer: Self-pay | Admitting: General Practice

## 2017-08-13 ENCOUNTER — Other Ambulatory Visit (INDEPENDENT_AMBULATORY_CARE_PROVIDER_SITE_OTHER): Payer: 59

## 2017-08-13 DIAGNOSIS — E039 Hypothyroidism, unspecified: Secondary | ICD-10-CM

## 2017-08-13 LAB — TSH: TSH: 2.89 u[IU]/mL (ref 0.35–4.50)

## 2017-08-16 ENCOUNTER — Other Ambulatory Visit: Payer: Self-pay

## 2017-08-16 ENCOUNTER — Ambulatory Visit (AMBULATORY_SURGERY_CENTER): Payer: Self-pay | Admitting: *Deleted

## 2017-08-16 VITALS — Ht 69.0 in | Wt 222.2 lb

## 2017-08-16 DIAGNOSIS — R195 Other fecal abnormalities: Secondary | ICD-10-CM

## 2017-08-16 MED ORDER — NA SULFATE-K SULFATE-MG SULF 17.5-3.13-1.6 GM/177ML PO SOLN: 1.0000 | Freq: Once | ORAL | 0 refills | Status: AC

## 2017-08-16 NOTE — Progress Notes (Signed)
Denies allergies to eggs or soy products. Denies complications with sedation or anesthesia. Denies O2 use. Denies use of diet or weight loss medications.  Emmi instructions given for colonoscopy.  

## 2017-08-24 ENCOUNTER — Encounter: Payer: Self-pay | Admitting: Gastroenterology

## 2017-08-30 ENCOUNTER — Encounter: Payer: 59 | Admitting: Gastroenterology

## 2017-09-04 ENCOUNTER — Ambulatory Visit (AMBULATORY_SURGERY_CENTER): Payer: 59 | Admitting: Gastroenterology

## 2017-09-04 ENCOUNTER — Other Ambulatory Visit: Payer: Self-pay

## 2017-09-04 ENCOUNTER — Encounter: Payer: Self-pay | Admitting: Gastroenterology

## 2017-09-04 VITALS — BP 111/82 | HR 61 | Temp 97.7°F | Resp 12 | Ht 69.0 in | Wt 222.0 lb

## 2017-09-04 DIAGNOSIS — D123 Benign neoplasm of transverse colon: Secondary | ICD-10-CM

## 2017-09-04 DIAGNOSIS — R195 Other fecal abnormalities: Secondary | ICD-10-CM | POA: Diagnosis not present

## 2017-09-04 DIAGNOSIS — K621 Rectal polyp: Secondary | ICD-10-CM | POA: Diagnosis not present

## 2017-09-04 DIAGNOSIS — D129 Benign neoplasm of anus and anal canal: Secondary | ICD-10-CM

## 2017-09-04 DIAGNOSIS — D128 Benign neoplasm of rectum: Secondary | ICD-10-CM

## 2017-09-04 MED ORDER — SODIUM CHLORIDE 0.9 % IV SOLN: 500.0000 mL | Freq: Once | INTRAVENOUS | Status: DC

## 2017-09-04 NOTE — Op Note (Signed)
[facility] Patient Name: Michele Chan Procedure Date: 09/04/2017 7:57 AM MRN: [id number] Endoscopist: Mauri Pole , MD Age: 62 Referring MD:  Date of Birth: [birth year] Gender: Female Account #: [id number] Procedure:                Colonoscopy Indications:              Positive Cologuard test Medicines:                Monitored Anesthesia Care Procedure:                Pre-Anesthesia Assessment:                           - Prior to the procedure, a History and Physical                            was performed, and patient medications and                            allergies were reviewed. The patient's tolerance of                            previous anesthesia was also reviewed. The risks                            and benefits of the procedure and the sedation                            options and risks were discussed with the patient.                            All questions were answered, and informed consent                            was obtained. Prior Anticoagulants: The patient has                            taken no previous anticoagulant or antiplatelet                            agents. ASA Grade Assessment: II - A patient with                            mild systemic disease. After reviewing the risks                            and benefits, the patient was deemed in                            satisfactory condition to undergo the procedure.                           After obtaining informed consent, the colonoscope  was passed under direct vision. Throughout the                            procedure, the patient's blood pressure, pulse, and                            oxygen saturations were monitored continuously. The                            Colonoscope was introduced through the anus and                            advanced to the the cecum, identified by                            appendiceal orifice and ileocecal valve.  The                            colonoscopy was performed without difficulty. The                            patient tolerated the procedure well. The quality                            of the bowel preparation was excellent. The                            ileocecal valve, appendiceal orifice, and rectum                            were photographed. Scope In: 8:08:26 AM Scope Out: 8:35:29 AM Scope Withdrawal Time: 0 hours 16 minutes 23 seconds  Total Procedure Duration: 0 hours 27 minutes 3 seconds  Findings:                 The perianal and digital rectal examinations were                            normal.                           Three sessile polyps were found in the rectum and                            transverse colon. The polyps were 4 to 6 mm in                            size. These polyps were removed with a cold snare.                            Resection and retrieval were complete.                           Two sessile polyps were found in the rectum. The  polyps were 1 to 2 mm in size. These polyps were                            removed with a cold biopsy forceps. Resection and                            retrieval were complete.                           Multiple small-mouthed diverticula were found in                            the sigmoid colon.                           Non-bleeding internal hemorrhoids were found during                            retroflexion. The hemorrhoids were small. Complications:            No immediate complications. Estimated Blood Loss:     Estimated blood loss was minimal. Impression:               - Three 4 to 6 mm polyps in the rectum and in the                            transverse colon, removed with a cold snare.                            Resected and retrieved.                           - Two 1 to 2 mm polyps in the rectum, removed with                            a cold biopsy forceps. Resected and  retrieved.                           - Moderate diverticulosis in the sigmoid colon.                           - Non-bleeding internal hemorrhoids. Recommendation:           - Patient has a contact number available for                            emergencies. The signs and symptoms of potential                            delayed complications were discussed with the                            patient. Return to normal activities tomorrow.  Written discharge instructions were provided to the                            patient.                           - Resume previous diet.                           - Continue present medications.                           - Await pathology results.                           - Repeat colonoscopy in 3 - 5 years for                            surveillance based on pathology results. Mauri Pole, MD 09/04/2017 8:41:21 AM This report has been signed electronically.

## 2017-09-04 NOTE — Progress Notes (Signed)
Called to room to assist during endoscopic procedure.  Patient ID and intended procedure confirmed with present staff. Received instructions for my participation in the procedure from the performing physician.  

## 2017-09-04 NOTE — Patient Instructions (Addendum)
YOU HAD AN ENDOSCOPIC PROCEDURE TODAY AT [facility]:   Refer to the procedure report that was given to you for any specific questions about what was found during the examination.  If the procedure report does not answer your questions, please call your gastroenterologist to clarify.  If you requested that your care partner not be given the details of your procedure findings, then the procedure report has been included in a sealed envelope for you to review at your convenience later.  YOU SHOULD EXPECT: Some feelings of bloating in the abdomen. Passage of more gas than usual.  Walking can help get rid of the air that was put into your GI tract during the procedure and reduce the bloating. If you had a lower endoscopy (such as a colonoscopy or flexible sigmoidoscopy) you may notice spotting of blood in your stool or on the toilet paper. If you underwent a bowel prep for your procedure, you may not have a normal bowel movement for a few days.  Please Note:  You might notice some irritation and congestion in your nose or some drainage.  This is from the oxygen used during your procedure.  There is no need for concern and it should clear up in a day or so.  SYMPTOMS TO REPORT IMMEDIATELY:   Following lower endoscopy (colonoscopy or flexible sigmoidoscopy):  Excessive amounts of blood in the stool  Significant tenderness or worsening of abdominal pains  Swelling of the abdomen that is new, acute  Fever of 100F or higher  For urgent or emergent issues, a gastroenterologist can be reached at any hour by calling [phone].   DIET:  We do recommend a small meal at first, but then you may proceed to your regular diet.  Drink plenty of fluids but you should avoid alcoholic beverages for 24 hours.  ACTIVITY:  You should plan to take it easy for the rest of today and you should NOT DRIVE or use heavy machinery until tomorrow (because of the sedation medicines used during the test).     FOLLOW UP: Our staff will call the number listed on your records the next business day following your procedure to check on you and address any questions or concerns that you may have regarding the information given to you following your procedure. If we do not reach you, we will leave a message.  However, if you are feeling well and you are not experiencing any problems, there is no need to return our call.  We will assume that you have returned to your regular daily activities without incident.  If any biopsies were taken you will be contacted by phone or by letter within the next 1-3 weeks.  Please call us at [phone] if you have not heard about the biopsies in 3 weeks.   Await for biopsy results to determine next repeat Colonoscopy screening Polyps (handout given) Hemorrhoids (handout given) Diverticulosis (handout given)  SIGNATURES/CONFIDENTIALITY: You and/or your care partner have signed paperwork which will be entered into your electronic medical record.  These signatures attest to the fact that that the information above on your After Visit Summary has been reviewed and is understood.  Full responsibility of the confidentiality of this discharge information lies with you and/or your care-partner.

## 2017-09-04 NOTE — Progress Notes (Signed)
I have reviewed the patient's medical history in detail and updated the computerized patient record.

## 2017-09-04 NOTE — Progress Notes (Signed)
Report to PACU, RN, vss, BBS= Clear.  

## 2017-09-05 ENCOUNTER — Telehealth: Payer: Self-pay | Admitting: *Deleted

## 2017-09-05 NOTE — Telephone Encounter (Signed)
  Follow up Call-  Call back number 09/04/2017  Post procedure Call Back phone  # [phone]  Permission to leave phone message Yes     Patient questions:  Do you have a fever, pain , or abdominal swelling? No. Pain Score  0 *  Have you tolerated food without any problems? Yes.    Have you been able to return to your normal activities? Yes.    Do you have any questions about your discharge instructions: Diet   No. Medications  No. Follow up visit  No.  Do you have questions or concerns about your Care? No.  Actions: * If pain score is 4 or above: No action needed, pain <4.

## 2017-09-12 ENCOUNTER — Encounter: Payer: Self-pay | Admitting: Gastroenterology

## 2017-10-12 ENCOUNTER — Other Ambulatory Visit: Payer: Self-pay | Admitting: Family Medicine

## 2017-11-11 ENCOUNTER — Other Ambulatory Visit: Payer: Self-pay | Admitting: Family Medicine

## 2018-06-27 IMAGING — DX DG CHEST 2V
2 series · 2 of 2 positions shown · non-contrast
Comparison: None.

CLINICAL DATA: Substernal pressure.  Cough for 1 week.

EXAM:
CHEST  2 VIEW

[chest pa]
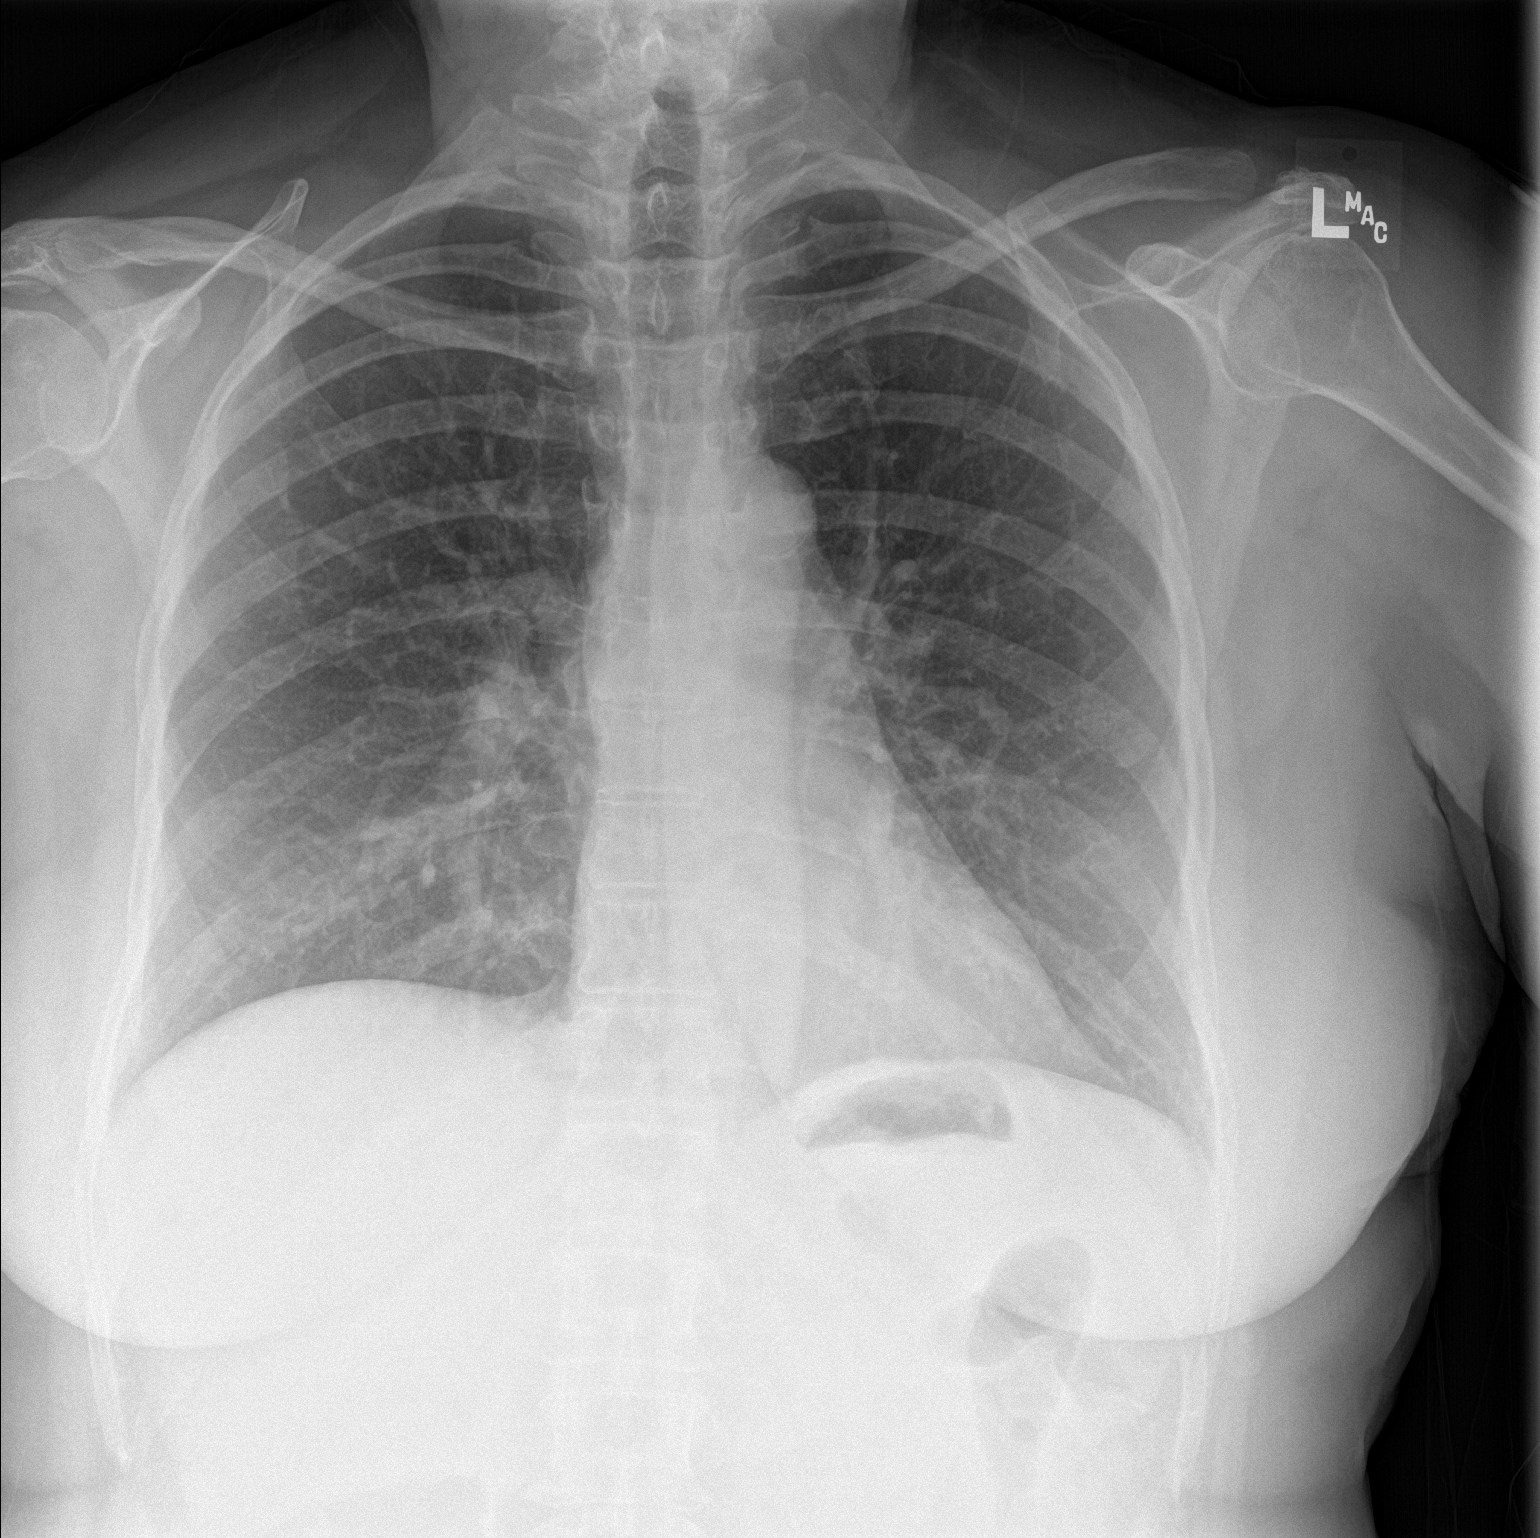

[chest lat]
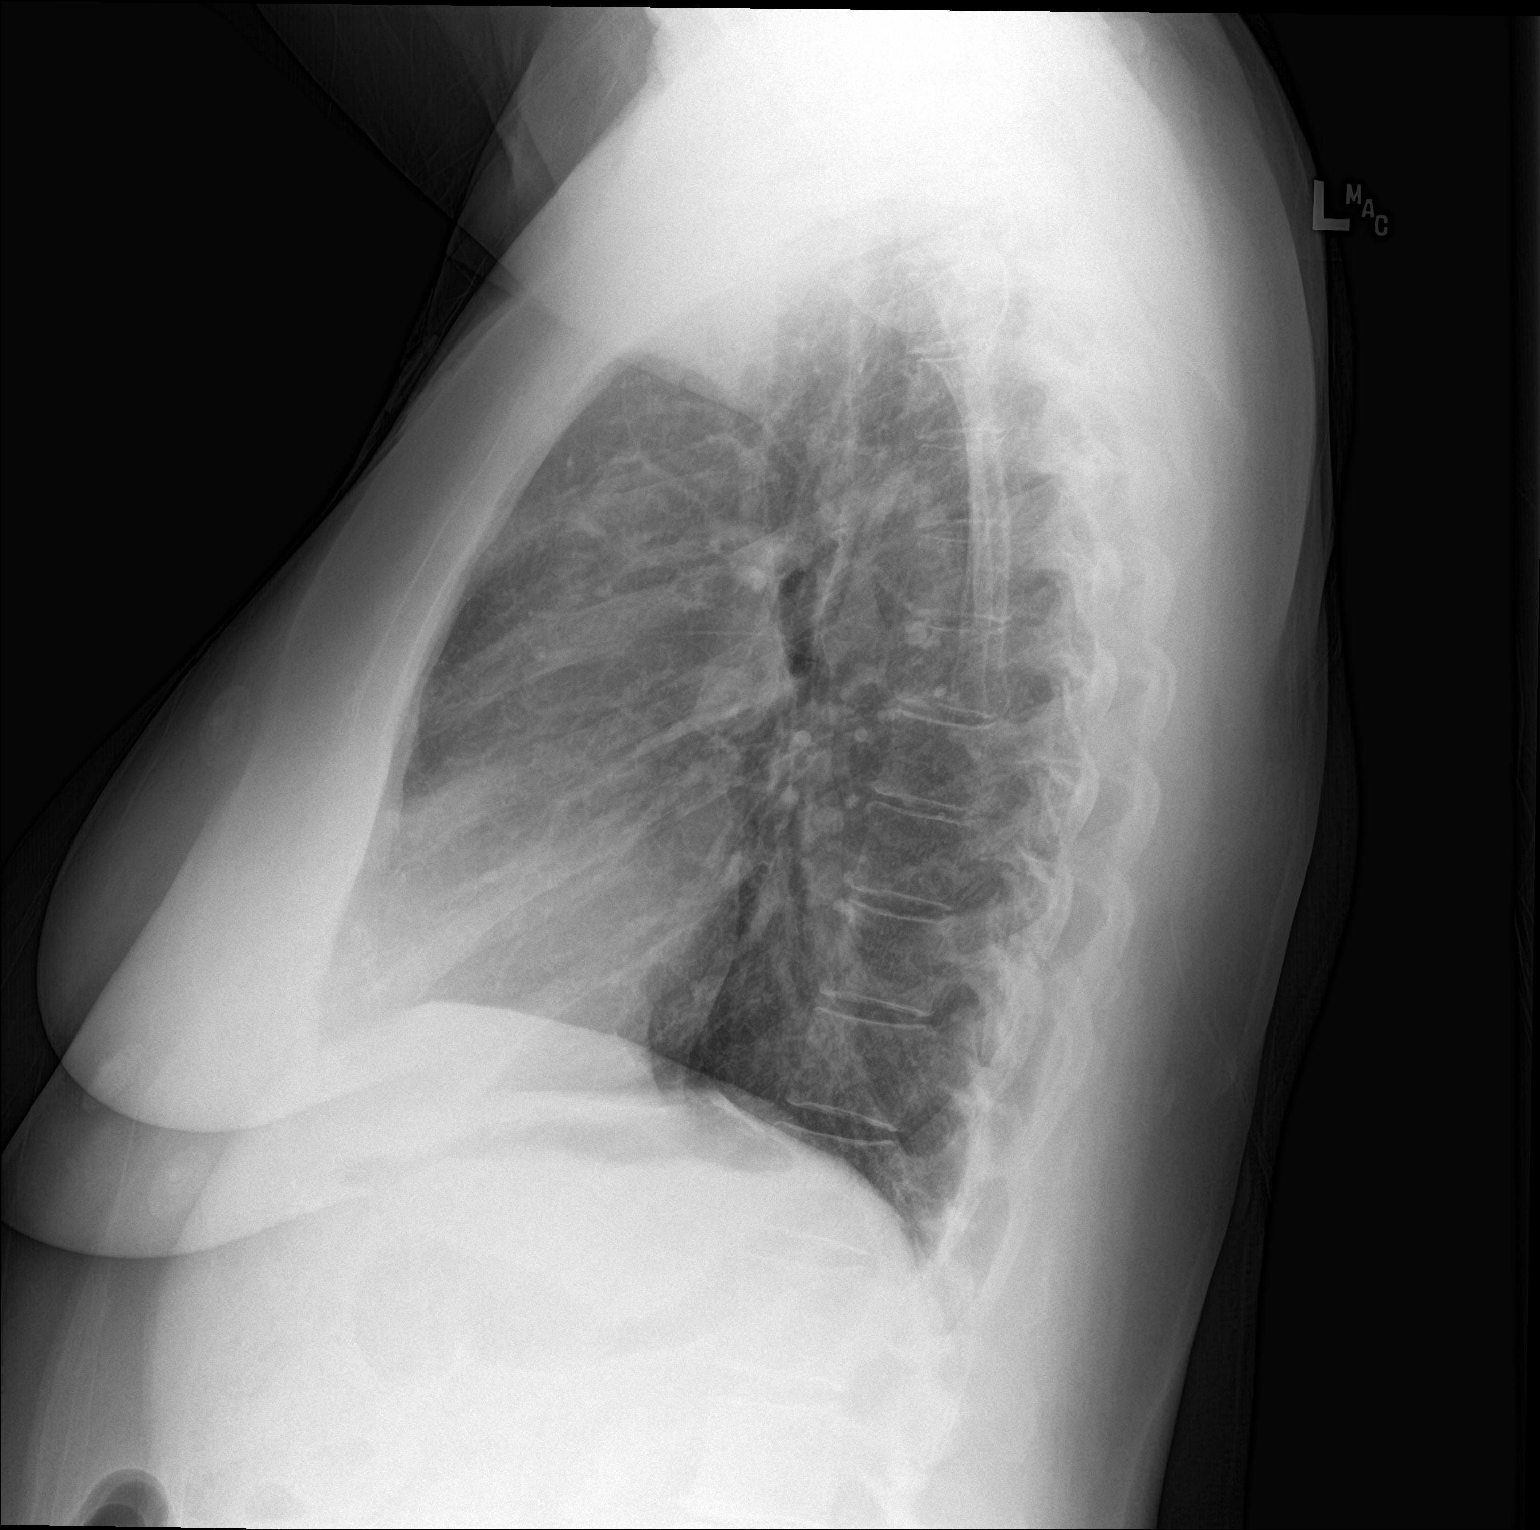

[2 of 2 positions shown; findings below may reference images not displayed]

FINDINGS: The lungs are clear. The pulmonary vasculature is normal. Heart size
is normal. Hilar and mediastinal contours are unremarkable. There is
no pleural effusion.
IMPRESSION: No active cardiopulmonary disease.

## 2018-08-11 ENCOUNTER — Other Ambulatory Visit: Payer: Self-pay | Admitting: Family Medicine

## 2018-08-17 ENCOUNTER — Encounter: Payer: Self-pay | Admitting: Family Medicine

## 2018-08-19 MED ORDER — ALBUTEROL SULFATE HFA 108 (90 BASE) MCG/ACT IN AERS: 2.0000 | INHALATION_SPRAY | RESPIRATORY_TRACT | 3 refills | Status: DC | PRN

## 2018-09-04 ENCOUNTER — Encounter: Payer: Self-pay | Admitting: Family Medicine

## 2018-09-04 ENCOUNTER — Ambulatory Visit (INDEPENDENT_AMBULATORY_CARE_PROVIDER_SITE_OTHER): Payer: 59 | Admitting: Family Medicine

## 2018-09-04 ENCOUNTER — Other Ambulatory Visit: Payer: Self-pay

## 2018-09-04 VITALS — Ht 69.0 in

## 2018-09-04 DIAGNOSIS — E669 Obesity, unspecified: Secondary | ICD-10-CM | POA: Diagnosis not present

## 2018-09-04 DIAGNOSIS — E039 Hypothyroidism, unspecified: Secondary | ICD-10-CM | POA: Insufficient documentation

## 2018-09-04 NOTE — Progress Notes (Signed)
I have discussed the procedure for the virtual visit with the patient who has given consent to proceed with assessment and treatment.   Pebble Botkin, CMA     

## 2018-09-04 NOTE — Progress Notes (Signed)
   Virtual Visit via Video   I connected with Michele Chan on 09/04/18 at  2:00 PM EDT by a video enabled telemedicine application and verified that I am speaking with the correct person using two identifiers. Location patient: Home Location provider: [facility], Office Persons participating in the virtual visit: pt and myself  I discussed the limitations of evaluation and management by telemedicine and the availability of in person appointments. The patient expressed understanding and agreed to proceed.  Subjective:   HPI:  Hypothyroid- chronic problem, on Levothyroxine 10mcg daily.  Pt is overdue for labs but not interested in getting them done at this time.  Pt reports feeling good.  Walking 2-3 miles/day.  Pt reports good energy.  Denies fatigue.  No changes to skin/hair/nails.  Denies constipation or diarrhea.  ROS: See pertinent positives and negatives per HPI.  Patient Active Problem List   Diagnosis Date Noted  . Physical exam 06/19/2017  . Obesity (BMI 30-39.9) 06/19/2017  . Chronic cough 03/09/2017    Social History   Tobacco Use  . Smoking status: Never Smoker  . Smokeless tobacco: Never Used  Substance Use Topics  . Alcohol use: Yes    Alcohol/week: 3.0 standard drinks    Types: 3 Standard drinks or equivalent per week    Current Outpatient Medications:  .  albuterol (PROAIR HFA) 108 (90 Base) MCG/ACT inhaler, Inhale 2 puffs into the lungs every 4 (four) hours as needed for wheezing or shortness of breath., Disp: 1 Inhaler, Rfl: 3 .  cetirizine (ZYRTEC) 10 MG tablet, Take 10 mg by mouth daily., Disp: , Rfl:  .  Ibuprofen (ADVIL PO), Take by mouth., Disp: , Rfl:  .  levothyroxine (SYNTHROID, LEVOTHROID) 88 MCG tablet, TAKE 1 TABLET BY MOUTH DAILY, Disp: 30 tablet, Rfl: 6 .  omeprazole (PRILOSEC) 10 MG capsule, Take 10 mg by mouth daily., Disp: , Rfl:  .  SYMBICORT 80-4.5 MCG/ACT inhaler, INHALE 2 PUFFS INTO THE LUNGS TWICE DAILY, Disp: 10.2 g, Rfl: 3   Current Facility-Administered Medications:  .  0.9 %  sodium chloride infusion, 500 mL, Intravenous, Once, Nandigam, Kavitha V, MD  No Known Allergies  Objective:   Ht 5\' 9"  (1.753 m)   BMI 32.78 kg/m  AAOx3, NAD NCAT, EOMI No obvious CN deficits Coloring WNL Pt is able to speak clearly, coherently without shortness of breath or increased work of breathing.  Thought process is linear.  Mood is appropriate.   Assessment and Plan:   Hypothyroid- chronic problem.  Currently asymptomatic.  Pt is overdue for labs but declines at this time due to [place].  Stressed need for healthy diet, regular exercise.  Pt expressed understanding and is in agreement w/ plan.    Annye Asa, MD 09/04/2018

## 2018-10-12 ENCOUNTER — Other Ambulatory Visit: Payer: Self-pay | Admitting: Family Medicine

## 2018-12-24 ENCOUNTER — Other Ambulatory Visit: Payer: Self-pay | Admitting: Family Medicine

## 2019-03-12 ENCOUNTER — Other Ambulatory Visit: Payer: Self-pay

## 2019-03-12 ENCOUNTER — Ambulatory Visit (INDEPENDENT_AMBULATORY_CARE_PROVIDER_SITE_OTHER): Payer: 59 | Admitting: Family Medicine

## 2019-03-12 ENCOUNTER — Encounter: Payer: Self-pay | Admitting: Family Medicine

## 2019-03-12 VITALS — BP 122/83 | HR 65 | Temp 97.9°F | Resp 16 | Ht 69.0 in | Wt 232.1 lb

## 2019-03-12 DIAGNOSIS — E669 Obesity, unspecified: Secondary | ICD-10-CM

## 2019-03-12 DIAGNOSIS — Z0000 Encounter for general adult medical examination without abnormal findings: Secondary | ICD-10-CM | POA: Diagnosis not present

## 2019-03-12 DIAGNOSIS — Z23 Encounter for immunization: Secondary | ICD-10-CM | POA: Diagnosis not present

## 2019-03-12 LAB — CBC WITH DIFFERENTIAL/PLATELET
Basophils Absolute: 0.1 10*3/uL (ref 0.0–0.1)
Basophils Relative: 0.8 % (ref 0.0–3.0)
Eosinophils Absolute: 0.4 10*3/uL (ref 0.0–0.7)
Eosinophils Relative: 5.3 % — ABNORMAL HIGH (ref 0.0–5.0)
HCT: 41.8 % (ref 36.0–46.0)
Hemoglobin: 14 g/dL (ref 12.0–15.0)
Lymphocytes Relative: 23.2 % (ref 12.0–46.0)
Lymphs Abs: 1.9 10*3/uL (ref 0.7–4.0)
MCHC: 33.4 g/dL (ref 30.0–36.0)
MCV: 92 fl (ref 78.0–100.0)
Monocytes Absolute: 0.6 10*3/uL (ref 0.1–1.0)
Monocytes Relative: 7 % (ref 3.0–12.0)
Neutro Abs: 5.3 10*3/uL (ref 1.4–7.7)
Neutrophils Relative %: 63.7 % (ref 43.0–77.0)
Platelets: 242 10*3/uL (ref 150.0–400.0)
RBC: 4.55 Mil/uL (ref 3.87–5.11)
RDW: 14.6 % (ref 11.5–15.5)
WBC: 8.3 10*3/uL (ref 4.0–10.5)

## 2019-03-12 LAB — BASIC METABOLIC PANEL
BUN: 14 mg/dL (ref 6–23)
CO2: 29 mEq/L (ref 19–32)
Calcium: 9.5 mg/dL (ref 8.4–10.5)
Chloride: 100 mEq/L (ref 96–112)
Creatinine, Ser: 0.81 mg/dL (ref 0.40–1.20)
GFR: 71.51 mL/min (ref >60.00)
Glucose, Bld: 110 mg/dL — ABNORMAL HIGH (ref 70–99)
Potassium: 4.2 mEq/L (ref 3.5–5.1)
Sodium: 137 mEq/L (ref 135–145)

## 2019-03-12 LAB — LIPID PANEL
Cholesterol: 248 mg/dL — ABNORMAL HIGH (ref 0–200)
HDL: 58.4 mg/dL (ref >39.00)
LDL Cholesterol: 167 mg/dL — ABNORMAL HIGH (ref 0–99)
NonHDL: 189.75
Total CHOL/HDL Ratio: 4
Triglycerides: 113 mg/dL (ref 0.0–149.0)
VLDL: 22.6 mg/dL (ref 0.0–40.0)

## 2019-03-12 LAB — HEPATIC FUNCTION PANEL
ALT: 16 U/L (ref 0–35)
AST: 13 U/L (ref 0–37)
Albumin: 4.4 g/dL (ref 3.5–5.2)
Alkaline Phosphatase: 81 U/L (ref 39–117)
Bilirubin, Direct: 0.1 mg/dL (ref 0.0–0.3)
Total Bilirubin: 0.7 mg/dL (ref 0.2–1.2)
Total Protein: 7.3 g/dL (ref 6.0–8.3)

## 2019-03-12 LAB — VITAMIN D 25 HYDROXY (VIT D DEFICIENCY, FRACTURES): VITD: 15.28 ng/mL — ABNORMAL LOW (ref 30.00–100.00)

## 2019-03-12 LAB — TSH: TSH: 4.37 u[IU]/mL (ref 0.35–4.50)

## 2019-03-12 NOTE — Progress Notes (Signed)
   Subjective:    Patient ID: Michele Chan, female    DOB: 20-Dec-1955, 63 y.o.   MRN: [id number]  HPI CPE- UTD on colonoscopy, due for mammogram- 'let's wait'.  Due for pap- 'I don't feel the need'.  UTD on Tdap.  Will get flu today.  + 10 lb weight gain.  Pt is attempting to walk more as she is unhappy w/ weight gain.   Review of Systems Patient reports no vision/ hearing changes, adenopathy,fever,  persistant/recurrent hoarseness , swallowing issues, chest pain, palpitations, edema, persistant/recurrent cough, hemoptysis, dyspnea (rest/exertional/paroxysmal nocturnal), gastrointestinal bleeding (melena, rectal bleeding), abdominal pain, significant heartburn, bowel changes, GU symptoms (dysuria, hematuria, incontinence), Gyn symptoms (abnormal  bleeding, pain),  syncope, focal weakness, memory loss, numbness & tingling, skin/hair/nail changes, abnormal bruising or bleeding, anxiety, or depression.     Objective:   Physical Exam General Appearance:    Alert, cooperative, no distress, appears stated age  Head:    Normocephalic, without obvious abnormality, atraumatic  Eyes:    PERRL, conjunctiva/corneas clear, EOM's intact, fundi    benign, both eyes  Ears:    Normal TM's and external ear canals, both ears  Nose:   Deferred due to COVID  Throat:   Neck:   Supple, symmetrical, trachea midline, no adenopathy;    Thyroid: no enlargement/tenderness/nodules  Back:     Symmetric, no curvature, ROM normal, no CVA tenderness  Lungs:     Clear to auscultation bilaterally, respirations unlabored  Chest Wall:    No tenderness or deformity   Heart:    Regular rate and rhythm, S1 and S2 normal, no murmur, rub   or gallop  Breast Exam:    Deferred at pt's request  Abdomen:     Soft, non-tender, bowel sounds active all four quadrants,    no masses, no organomegaly  Genitalia:    Deferred at pt's request  Rectal:    Extremities:   Extremities normal, atraumatic, no cyanosis or edema  Pulses:   2+  and symmetric all extremities  Skin:   Skin color, texture, turgor normal, no rashes or lesions  Lymph nodes:   Cervical, supraclavicular, and axillary nodes normal  Neurologic:   CNII-XII intact, normal strength, sensation and reflexes    throughout          Assessment & Plan:

## 2019-03-12 NOTE — Assessment & Plan Note (Signed)
Deteriorated.  Pt has gained 10 lbs since last visit.  Stressed need for healthy diet and regular exercise.  Check labs to risk stratify.

## 2019-03-12 NOTE — Assessment & Plan Note (Signed)
Pt's PE WNL w/ exception of obesity.  Pt declines pap and mammo.  Flu given today.  Check labs.  Anticipatory guidance provided.

## 2019-03-12 NOTE — Patient Instructions (Addendum)
Follow up in 1 year or as needed We'll notify you of your lab results and make any changes if needed Continue to work on healthy diet and regular exercise- you can do it!!! Please schedule a mammogram this spring Call with any questions or concerns Stay Safe!  Stay Healthy!!!

## 2019-03-13 ENCOUNTER — Other Ambulatory Visit: Payer: Self-pay | Admitting: General Practice

## 2019-03-13 DIAGNOSIS — E785 Hyperlipidemia, unspecified: Secondary | ICD-10-CM

## 2019-03-13 MED ORDER — ROSUVASTATIN CALCIUM 10 MG PO TABS: 10.0000 mg | ORAL_TABLET | Freq: Every day | ORAL | 0 refills | Status: DC

## 2019-03-13 MED ORDER — VITAMIN D (ERGOCALCIFEROL) 1.25 MG (50000 UNIT) PO CAPS: 50000.0000 [IU] | ORAL_CAPSULE | ORAL | 0 refills | Status: DC

## 2019-05-26 ENCOUNTER — Other Ambulatory Visit: Payer: Self-pay | Admitting: *Deleted

## 2019-05-26 MED ORDER — LEVOTHYROXINE SODIUM 88 MCG PO TABS: 88.0000 ug | ORAL_TABLET | Freq: Every day | ORAL | 6 refills | Status: DC

## 2019-05-27 ENCOUNTER — Encounter: Payer: Self-pay | Admitting: Family Medicine

## 2019-06-10 ENCOUNTER — Encounter: Payer: Self-pay | Admitting: Family Medicine

## 2019-06-16 ENCOUNTER — Other Ambulatory Visit: Payer: Self-pay | Admitting: General Practice

## 2019-06-16 MED ORDER — ROSUVASTATIN CALCIUM 10 MG PO TABS: 10.0000 mg | ORAL_TABLET | Freq: Every day | ORAL | 0 refills | Status: DC

## 2019-09-22 ENCOUNTER — Other Ambulatory Visit: Payer: Self-pay | Admitting: General Practice

## 2019-09-22 MED ORDER — ROSUVASTATIN CALCIUM 10 MG PO TABS: 10.0000 mg | ORAL_TABLET | Freq: Every day | ORAL | 0 refills | Status: DC

## 2019-12-12 ENCOUNTER — Other Ambulatory Visit: Payer: Self-pay | Admitting: Family Medicine

## 2020-01-12 ENCOUNTER — Other Ambulatory Visit: Payer: Self-pay | Admitting: General Practice

## 2020-01-12 MED ORDER — BUDESONIDE-FORMOTEROL FUMARATE 80-4.5 MCG/ACT IN AERO: 2.0000 | INHALATION_SPRAY | Freq: Two times a day (BID) | RESPIRATORY_TRACT | 3 refills | Status: DC

## 2020-03-11 ENCOUNTER — Other Ambulatory Visit: Payer: Self-pay | Admitting: Family Medicine

## 2020-03-12 ENCOUNTER — Encounter: Payer: 59 | Admitting: Family Medicine

## 2020-03-24 ENCOUNTER — Ambulatory Visit (INDEPENDENT_AMBULATORY_CARE_PROVIDER_SITE_OTHER): Payer: 59 | Admitting: Family Medicine

## 2020-03-24 ENCOUNTER — Other Ambulatory Visit: Payer: Self-pay

## 2020-03-24 ENCOUNTER — Encounter: Payer: Self-pay | Admitting: Family Medicine

## 2020-03-24 VITALS — BP 126/78 | HR 63 | Temp 98.2°F | Resp 17 | Ht 68.0 in | Wt 227.4 lb

## 2020-03-24 DIAGNOSIS — E559 Vitamin D deficiency, unspecified: Secondary | ICD-10-CM | POA: Diagnosis not present

## 2020-03-24 DIAGNOSIS — Z1231 Encounter for screening mammogram for malignant neoplasm of breast: Secondary | ICD-10-CM | POA: Diagnosis not present

## 2020-03-24 DIAGNOSIS — Z0000 Encounter for general adult medical examination without abnormal findings: Secondary | ICD-10-CM | POA: Diagnosis not present

## 2020-03-24 DIAGNOSIS — E669 Obesity, unspecified: Secondary | ICD-10-CM

## 2020-03-24 DIAGNOSIS — Z23 Encounter for immunization: Secondary | ICD-10-CM

## 2020-03-24 LAB — CBC WITH DIFFERENTIAL/PLATELET
Basophils Absolute: 0.1 10*3/uL (ref 0.0–0.1)
Basophils Relative: 1 % (ref 0.0–3.0)
Eosinophils Absolute: 0.4 10*3/uL (ref 0.0–0.7)
Eosinophils Relative: 5.9 % — ABNORMAL HIGH (ref 0.0–5.0)
HCT: 40.8 % (ref 36.0–46.0)
Hemoglobin: 13.8 g/dL (ref 12.0–15.0)
Lymphocytes Relative: 27.8 % (ref 12.0–46.0)
Lymphs Abs: 1.9 10*3/uL (ref 0.7–4.0)
MCHC: 33.8 g/dL (ref 30.0–36.0)
MCV: 91.4 fl (ref 78.0–100.0)
Monocytes Absolute: 0.6 10*3/uL (ref 0.1–1.0)
Monocytes Relative: 8.4 % (ref 3.0–12.0)
Neutro Abs: 3.9 10*3/uL (ref 1.4–7.7)
Neutrophils Relative %: 56.9 % (ref 43.0–77.0)
Platelets: 211 10*3/uL (ref 150.0–400.0)
RBC: 4.46 Mil/uL (ref 3.87–5.11)
RDW: 14.3 % (ref 11.5–15.5)
WBC: 6.8 10*3/uL (ref 4.0–10.5)

## 2020-03-24 LAB — HEPATIC FUNCTION PANEL
ALT: 14 U/L (ref 0–35)
AST: 14 U/L (ref 0–37)
Albumin: 4.3 g/dL (ref 3.5–5.2)
Alkaline Phosphatase: 79 U/L (ref 39–117)
Bilirubin, Direct: 0.1 mg/dL (ref 0.0–0.3)
Total Bilirubin: 0.6 mg/dL (ref 0.2–1.2)
Total Protein: 7.3 g/dL (ref 6.0–8.3)

## 2020-03-24 LAB — BASIC METABOLIC PANEL
BUN: 16 mg/dL (ref 6–23)
CO2: 27 mEq/L (ref 19–32)
Calcium: 9.4 mg/dL (ref 8.4–10.5)
Chloride: 103 mEq/L (ref 96–112)
Creatinine, Ser: 0.79 mg/dL (ref 0.40–1.20)
GFR: 79.5 mL/min (ref >60.00)
Glucose, Bld: 101 mg/dL — ABNORMAL HIGH (ref 70–99)
Potassium: 4.6 mEq/L (ref 3.5–5.1)
Sodium: 140 mEq/L (ref 135–145)

## 2020-03-24 LAB — TSH: TSH: 5.56 u[IU]/mL — ABNORMAL HIGH (ref 0.35–4.50)

## 2020-03-24 LAB — LIPID PANEL
Cholesterol: 151 mg/dL (ref 0–200)
HDL: 61.9 mg/dL (ref >39.00)
LDL Cholesterol: 73 mg/dL (ref 0–99)
NonHDL: 89.3
Total CHOL/HDL Ratio: 2
Triglycerides: 82 mg/dL (ref 0.0–149.0)
VLDL: 16.4 mg/dL (ref 0.0–40.0)

## 2020-03-24 LAB — VITAMIN D 25 HYDROXY (VIT D DEFICIENCY, FRACTURES): VITD: 17.52 ng/mL — ABNORMAL LOW (ref 30.00–100.00)

## 2020-03-24 NOTE — Assessment & Plan Note (Signed)
Pt is down 5 lbs since last visit.  Applauded her efforts at healthy diet and regular exercise.  Check labs to risk stratify.  Will continue to follow.

## 2020-03-24 NOTE — Assessment & Plan Note (Signed)
Pt's PE WNL w/ exception of obesity.  UTD on Tdap, COVID.  Flu shot given today.  Overdue for mammo- ordered.  Overdue for pap- pt declines.  UTD on cologuard.  Check labs.  Anticipatory guidance provided.

## 2020-03-24 NOTE — Patient Instructions (Signed)
Follow up in 6 months to recheck cholesterol We'll notify you of your lab results and make any changes if needed We'll call you with your mammogram appt Continue to work on healthy diet and regular exercise- you're doing great!  You're down 5 lbs!!! Get your booster at your convenience Call with any questions or concerns ENJOY THE [place]!!!

## 2020-03-24 NOTE — Progress Notes (Signed)
   Subjective:    Patient ID: Michele Chan, female    DOB: 1955/06/10, 64 y.o.   MRN: [id number]  HPI CPE- UTD on colon cancer screen.  UTD on Tdap, COVID vaccines.  Due for flu shot.  Due for pap and mammo- pt is very hesitant.  Pt is down 5 lbs since last visit.  Reviewed past medical, surgical, family and social histories.   Health Maintenance  Topic Date Due  . INFLUENZA VACCINE  12/21/2019  . PAP SMEAR-Modifier  03/24/2020 (Originally 08/10/1977)  . MAMMOGRAM  03/24/2021 (Originally 07/11/2019)  . Hepatitis C Screening  03/24/2021 (Originally April 09, 1956)  . HIV Screening  03/24/2021 (Originally 08/11/1971)  . Fecal DNA (Cologuard)  07/03/2020  . TETANUS/TDAP  06/20/2027  . COVID-19 Vaccine  Completed      Review of Systems Patient reports no vision/ hearing changes, adenopathy,fever, weight change,  persistant/recurrent hoarseness , swallowing issues, chest pain, palpitations, edema, persistant/recurrent cough, hemoptysis, dyspnea (rest/exertional/paroxysmal nocturnal), gastrointestinal bleeding (melena, rectal bleeding), abdominal pain, significant heartburn, bowel changes, GU symptoms (dysuria, hematuria, incontinence), Gyn symptoms (abnormal  bleeding, pain),  syncope, focal weakness, memory loss, numbness & tingling, skin/hair/nail changes, abnormal bruising or bleeding, anxiety, or depression.   This visit occurred during the SARS-CoV-2 public health emergency.  Safety protocols were in place, including screening questions prior to the visit, additional usage of staff PPE, and extensive cleaning of exam room while observing appropriate contact time as indicated for disinfecting solutions.       Objective:   Physical Exam General Appearance:    Alert, cooperative, no distress, appears stated age, obese  Head:    Normocephalic, without obvious abnormality, atraumatic  Eyes:    PERRL, conjunctiva/corneas clear, EOM's intact, fundi    benign, both eyes  Ears:    Normal TM's and  external ear canals, both ears  Nose:   Deferred due to COVID  Throat:   Neck:   Supple, symmetrical, trachea midline, no adenopathy;    Thyroid: no enlargement/tenderness/nodules  Back:     Symmetric, no curvature, ROM normal, no CVA tenderness  Lungs:     Clear to auscultation bilaterally, respirations unlabored  Chest Wall:    No tenderness or deformity   Heart:    Regular rate and rhythm, S1 and S2 normal, no murmur, rub   or gallop  Breast Exam:    Deferred to mammo  Abdomen:     Soft, non-tender, bowel sounds active all four quadrants,    no masses, no organomegaly  Genitalia:    Deferred to GYN  Rectal:    Extremities:   Extremities normal, atraumatic, no cyanosis or edema  Pulses:   2+ and symmetric all extremities  Skin:   Skin color, texture, turgor normal, no rashes or lesions  Lymph nodes:   Cervical, supraclavicular, and axillary nodes normal  Neurologic:   CNII-XII intact, normal strength, sensation and reflexes    throughout          Assessment & Plan:

## 2020-03-24 NOTE — Assessment & Plan Note (Signed)
Pt has hx of this.  Check labs and replete prn. 

## 2020-03-25 ENCOUNTER — Other Ambulatory Visit: Payer: Self-pay

## 2020-03-25 DIAGNOSIS — E559 Vitamin D deficiency, unspecified: Secondary | ICD-10-CM

## 2020-03-25 DIAGNOSIS — E039 Hypothyroidism, unspecified: Secondary | ICD-10-CM

## 2020-03-25 MED ORDER — VITAMIN D (ERGOCALCIFEROL) 1.25 MG (50000 UNIT) PO CAPS: 50000.0000 [IU] | ORAL_CAPSULE | ORAL | 0 refills | Status: DC

## 2020-03-25 MED ORDER — LEVOTHYROXINE SODIUM 100 MCG PO TABS: 100.0000 ug | ORAL_TABLET | Freq: Every day | ORAL | 3 refills | Status: DC

## 2020-03-25 NOTE — Progress Notes (Signed)
erg ?

## 2020-06-23 ENCOUNTER — Other Ambulatory Visit: Payer: Self-pay | Admitting: Family Medicine

## 2020-09-21 ENCOUNTER — Other Ambulatory Visit: Payer: Self-pay | Admitting: Family Medicine

## 2020-09-22 ENCOUNTER — Ambulatory Visit (INDEPENDENT_AMBULATORY_CARE_PROVIDER_SITE_OTHER): Payer: No Typology Code available for payment source | Admitting: Family Medicine

## 2020-09-22 ENCOUNTER — Other Ambulatory Visit: Payer: Self-pay

## 2020-09-22 ENCOUNTER — Encounter: Payer: Self-pay | Admitting: Family Medicine

## 2020-09-22 VITALS — BP 110/70 | HR 62 | Temp 98.6°F | Resp 19 | Ht 68.0 in | Wt 228.2 lb

## 2020-09-22 DIAGNOSIS — E785 Hyperlipidemia, unspecified: Secondary | ICD-10-CM | POA: Diagnosis not present

## 2020-09-22 DIAGNOSIS — E669 Obesity, unspecified: Secondary | ICD-10-CM

## 2020-09-22 DIAGNOSIS — E039 Hypothyroidism, unspecified: Secondary | ICD-10-CM | POA: Diagnosis not present

## 2020-09-22 LAB — CBC WITH DIFFERENTIAL/PLATELET
Basophils Absolute: 0.1 10*3/uL (ref 0.0–0.1)
Basophils Relative: 0.9 % (ref 0.0–3.0)
Eosinophils Absolute: 0.3 10*3/uL (ref 0.0–0.7)
Eosinophils Relative: 4.7 % (ref 0.0–5.0)
HCT: 41.6 % (ref 36.0–46.0)
Hemoglobin: 14.1 g/dL (ref 12.0–15.0)
Lymphocytes Relative: 31.9 % (ref 12.0–46.0)
Lymphs Abs: 2.1 10*3/uL (ref 0.7–4.0)
MCHC: 33.9 g/dL (ref 30.0–36.0)
MCV: 91.7 fl (ref 78.0–100.0)
Monocytes Absolute: 0.6 10*3/uL (ref 0.1–1.0)
Monocytes Relative: 8.8 % (ref 3.0–12.0)
Neutro Abs: 3.5 10*3/uL (ref 1.4–7.7)
Neutrophils Relative %: 53.7 % (ref 43.0–77.0)
Platelets: 254 10*3/uL (ref 150.0–400.0)
RBC: 4.54 Mil/uL (ref 3.87–5.11)
RDW: 14.3 % (ref 11.5–15.5)
WBC: 6.5 10*3/uL (ref 4.0–10.5)

## 2020-09-22 LAB — LIPID PANEL
Cholesterol: 259 mg/dL — ABNORMAL HIGH (ref 0–200)
HDL: 63.1 mg/dL (ref >39.00)
LDL Cholesterol: 170 mg/dL — ABNORMAL HIGH (ref 0–99)
NonHDL: 196.15
Total CHOL/HDL Ratio: 4
Triglycerides: 131 mg/dL (ref 0.0–149.0)
VLDL: 26.2 mg/dL (ref 0.0–40.0)

## 2020-09-22 LAB — BASIC METABOLIC PANEL
BUN: 16 mg/dL (ref 6–23)
CO2: 28 mEq/L (ref 19–32)
Calcium: 9.9 mg/dL (ref 8.4–10.5)
Chloride: 100 mEq/L (ref 96–112)
Creatinine, Ser: 0.9 mg/dL (ref 0.40–1.20)
GFR: 67.75 mL/min (ref >60.00)
Glucose, Bld: 106 mg/dL — ABNORMAL HIGH (ref 70–99)
Potassium: 4.4 mEq/L (ref 3.5–5.1)
Sodium: 136 mEq/L (ref 135–145)

## 2020-09-22 LAB — TSH: TSH: 23.58 u[IU]/mL — ABNORMAL HIGH (ref 0.35–4.50)

## 2020-09-22 LAB — HEPATIC FUNCTION PANEL
ALT: 16 U/L (ref 0–35)
AST: 16 U/L (ref 0–37)
Albumin: 4.5 g/dL (ref 3.5–5.2)
Alkaline Phosphatase: 80 U/L (ref 39–117)
Bilirubin, Direct: 0.1 mg/dL (ref 0.0–0.3)
Total Bilirubin: 0.7 mg/dL (ref 0.2–1.2)
Total Protein: 7.7 g/dL (ref 6.0–8.3)

## 2020-09-22 NOTE — Assessment & Plan Note (Signed)
Ongoing issue for pt.  She is now exercising regularly w/ a trainer and I applauded her efforts.  Will continue to follow.

## 2020-09-22 NOTE — Patient Instructions (Signed)
Schedule your complete physical in 6 months We'll notify you of your lab results and make any changes if needed Keep up the good work on healthy diet and regular exercise- you're doing great!!! Call with any questions or concerns Stay Safe!  Stay Healthy!! Happy Spring!!! 

## 2020-09-22 NOTE — Assessment & Plan Note (Signed)
Chronic problem.  Currently asymptomatic on Levothyroxine 153mcg daily.  She reports she has been erratic in taking medication over the last 3 months.  Will need to be mindful of this when we get the results

## 2020-09-22 NOTE — Progress Notes (Signed)
   Subjective:    Patient ID: Marylouise Mallet, female    DOB: 03-Nov-1955, 65 y.o.   MRN: [id number]  HPI Hyperlipidemia- chronic problem, on Crestor 10mg  daily.  No CP, SOB, abd pain, N/V  Hypothyroid- chronic problem, on Levothyroxine 172mcg daily. Good energy level, denies changes to skin/hair/nails.  Obesity- pt's BMI is 34.7.  Exercising 3x/week for 30 minutes.  Not following particular diet.   Review of Systems For ROS see HPI   This visit occurred during the SARS-CoV-2 public health emergency.  Safety protocols were in place, including screening questions prior to the visit, additional usage of staff PPE, and extensive cleaning of exam room while observing appropriate contact time as indicated for disinfecting solutions.       Objective:   Physical Exam Vitals reviewed.  Constitutional:      General: She is not in acute distress.    Appearance: Normal appearance. She is well-developed. She is obese.  HENT:     Head: Normocephalic and atraumatic.  Eyes:     Conjunctiva/sclera: Conjunctivae normal.     Pupils: Pupils are equal, round, and reactive to light.  Neck:     Thyroid: No thyromegaly.  Cardiovascular:     Rate and Rhythm: Normal rate and regular rhythm.     Pulses: Normal pulses.     Heart sounds: Normal heart sounds. No murmur heard.   Pulmonary:     Effort: Pulmonary effort is normal. No respiratory distress.     Breath sounds: Normal breath sounds.  Abdominal:     General: There is no distension.     Palpations: Abdomen is soft.     Tenderness: There is no abdominal tenderness.  Musculoskeletal:     Cervical back: Normal range of motion and neck supple.     Right lower leg: No edema.     Left lower leg: No edema.  Lymphadenopathy:     Cervical: No cervical adenopathy.  Skin:    General: Skin is warm and dry.  Neurological:     Mental Status: She is alert and oriented to person, place, and time.  Psychiatric:        Behavior: Behavior normal.            Assessment & Plan:

## 2020-09-22 NOTE — Assessment & Plan Note (Signed)
Chronic problem.  On Crestor 10mg  daily.  She reports she has been erratic in taking meds over the last 3 months.  Has been exercising at least 3x/week w/ a trainer.  Check labs.  Adjust meds prn

## 2020-09-23 ENCOUNTER — Other Ambulatory Visit: Payer: Self-pay

## 2020-09-23 DIAGNOSIS — E039 Hypothyroidism, unspecified: Secondary | ICD-10-CM

## 2020-10-26 ENCOUNTER — Other Ambulatory Visit: Payer: No Typology Code available for payment source

## 2020-10-28 ENCOUNTER — Other Ambulatory Visit: Payer: Self-pay

## 2020-10-28 ENCOUNTER — Other Ambulatory Visit (INDEPENDENT_AMBULATORY_CARE_PROVIDER_SITE_OTHER): Payer: No Typology Code available for payment source

## 2020-10-28 DIAGNOSIS — E039 Hypothyroidism, unspecified: Secondary | ICD-10-CM | POA: Diagnosis not present

## 2020-10-28 LAB — TSH: TSH: 1.77 u[IU]/mL (ref 0.35–4.50)

## 2020-11-17 ENCOUNTER — Encounter: Payer: Self-pay | Admitting: *Deleted

## 2021-01-13 ENCOUNTER — Encounter: Payer: Self-pay | Admitting: Family Medicine

## 2021-02-16 ENCOUNTER — Ambulatory Visit (INDEPENDENT_AMBULATORY_CARE_PROVIDER_SITE_OTHER): Payer: No Typology Code available for payment source | Admitting: Family Medicine

## 2021-02-16 ENCOUNTER — Other Ambulatory Visit: Payer: Self-pay

## 2021-02-16 ENCOUNTER — Encounter: Payer: Self-pay | Admitting: Family Medicine

## 2021-02-16 VITALS — BP 116/78 | HR 66 | Temp 98.2°F | Resp 16 | Ht 68.0 in | Wt 227.4 lb

## 2021-02-16 DIAGNOSIS — Z7184 Encounter for health counseling related to travel: Secondary | ICD-10-CM | POA: Diagnosis not present

## 2021-02-16 MED ORDER — ACETAZOLAMIDE 125 MG PO TABS: 125.0000 mg | ORAL_TABLET | Freq: Three times a day (TID) | ORAL | 0 refills | Status: DC

## 2021-02-16 MED ORDER — ALBUTEROL SULFATE HFA 108 (90 BASE) MCG/ACT IN AERS: 2.0000 | INHALATION_SPRAY | Freq: Four times a day (QID) | RESPIRATORY_TRACT | 3 refills | Status: AC | PRN

## 2021-02-16 NOTE — Patient Instructions (Signed)
Follow up as needed or as scheduled START the Diamox 1-2 days prior to arriving at high altitude and continue for 2 days at elevation Ask the health clinic about malaria meds Call with any questions or concerns HAVE AN AMAZING ADVENTURE!!!

## 2021-02-16 NOTE — Progress Notes (Signed)
   Subjective:    Patient ID: Michele Chan, female    DOB: Aug 01, 1955, 65 y.o.   MRN: [id number]  HPI Travel advice- pt is taking an around the world trip in Feb and will be gone for 24 days.  She has a medical form to be completed indicating that her health is such that she will be able to participate in scheduled events.  Also wants to know about altitude sickness medication and possible malaria prevention.   Review of Systems For ROS see HPI     Objective:   Physical Exam Vitals reviewed.  Constitutional:      General: She is not in acute distress.    Appearance: Normal appearance. She is not ill-appearing.  HENT:     Head: Normocephalic and atraumatic.  Eyes:     Extraocular Movements: Extraocular movements intact.     Conjunctiva/sclera: Conjunctivae normal.     Pupils: Pupils are equal, round, and reactive to light.  Cardiovascular:     Rate and Rhythm: Normal rate and regular rhythm.  Pulmonary:     Effort: Pulmonary effort is normal. No respiratory distress.     Breath sounds: Normal breath sounds.  Skin:    General: Skin is warm and dry.  Neurological:     General: No focal deficit present.     Mental Status: She is alert and oriented to person, place, and time.  Psychiatric:        Mood and Affect: Mood normal.        Behavior: Behavior normal.        Thought Content: Thought content normal.          Assessment & Plan:  Travel advice- pt is taking a fantastic trip this winter and has forms to be completed.  There is nothing that would prevent her from traveling or participating in the scheduled activities.  I provided Diamox as she is going to both [place], [place], and [place].  She has an appt w/ the travel clinic later this afternoon for the yellow fever vaccine.  I encouraged her to ask them about the need for malaria medication.  Pt expressed understanding and is in agreement w/ plan.

## 2021-02-18 ENCOUNTER — Encounter: Payer: Self-pay | Admitting: Family Medicine

## 2021-02-19 ENCOUNTER — Encounter: Payer: Self-pay | Admitting: Family Medicine

## 2021-03-16 ENCOUNTER — Other Ambulatory Visit: Payer: Self-pay | Admitting: Family Medicine

## 2021-04-21 ENCOUNTER — Other Ambulatory Visit: Payer: Self-pay

## 2021-04-21 ENCOUNTER — Encounter: Payer: Self-pay | Admitting: Family Medicine

## 2021-04-21 DIAGNOSIS — E039 Hypothyroidism, unspecified: Secondary | ICD-10-CM

## 2021-04-21 MED ORDER — LEVOTHYROXINE SODIUM 100 MCG PO TABS
100.0000 ug | ORAL_TABLET | Freq: Every day | ORAL | 3 refills | Status: DC
Start: 2021-04-21 — End: 2022-05-17

## 2021-05-18 ENCOUNTER — Other Ambulatory Visit: Payer: Self-pay

## 2021-05-18 MED ORDER — ROSUVASTATIN CALCIUM 10 MG PO TABS: ORAL_TABLET | ORAL | 0 refills | Status: DC

## 2021-06-01 ENCOUNTER — Ambulatory Visit (HOSPITAL_BASED_OUTPATIENT_CLINIC_OR_DEPARTMENT_OTHER): Payer: No Typology Code available for payment source | Admitting: Orthopaedic Surgery

## 2021-06-13 ENCOUNTER — Encounter: Payer: Self-pay | Admitting: Nurse Practitioner

## 2021-06-13 ENCOUNTER — Other Ambulatory Visit (HOSPITAL_COMMUNITY)
Admission: RE | Admit: 2021-06-13 | Discharge: 2021-06-13 | Disposition: A | Discharge status: Home / Self Care | Payer: No Typology Code available for payment source | Source: Ambulatory Visit | Attending: Nurse Practitioner | Admitting: Nurse Practitioner

## 2021-06-13 ENCOUNTER — Ambulatory Visit (INDEPENDENT_AMBULATORY_CARE_PROVIDER_SITE_OTHER): Payer: PRIVATE HEALTH INSURANCE | Admitting: Nurse Practitioner

## 2021-06-13 ENCOUNTER — Other Ambulatory Visit: Payer: Self-pay

## 2021-06-13 VITALS — BP 126/82 | Ht 68.0 in | Wt 228.0 lb

## 2021-06-13 DIAGNOSIS — N950 Postmenopausal bleeding: Secondary | ICD-10-CM | POA: Diagnosis not present

## 2021-06-13 DIAGNOSIS — Z124 Encounter for screening for malignant neoplasm of cervix: Secondary | ICD-10-CM | POA: Diagnosis not present

## 2021-06-13 NOTE — Progress Notes (Signed)
° °  Acute Office Visit  Subjective:    Patient ID: Michele Chan, female    DOB: 08-18-1955, 66 y.o.   MRN: [id number]   HPI 66 y.o. presents today for postmenopausal bleeding. Bleeding began 6 days ago and has been mostly light but does require pad use. She changes more frequently than needed due to hygiene preference but she expects that she could wear it half to most of the day. Postmenopausal since age 40. No HRT. Normal pap history, but last one was years ago. Not sexually active.   Review of Systems  Constitutional: Negative.   Genitourinary:  Positive for vaginal bleeding.      Objective:    Physical Exam Constitutional:      Appearance: Normal appearance.  Genitourinary:    General: Normal vulva.     Vagina: Bleeding present.     Cervix: Normal.     Uterus: Normal.     BP 126/82    Ht 5\' 8"  (1.727 m)    Wt 228 lb (103.4 kg)    BMI 34.67 kg/m  Wt Readings from Last 3 Encounters:  06/13/21 228 lb (103.4 kg)  02/16/21 227 lb 6.4 oz (103.1 kg)  09/22/20 228 lb 3.2 oz (103.5 kg)        Assessment & Plan:   Problem List Items Addressed This Visit   None Visit Diagnoses     Postmenopausal bleeding    -  Primary   Relevant Orders   US PELVIS TRANSVAGINAL NON-OB (TV ONLY)   Encounter for Papanicolaou smear for cervical cancer screening       Relevant Orders   Cytology - PAP( [place])      Plan: Will schedule pelvic ultrasound for further evaluation. Pap pending.      Tamela Gammon DNP, 12:26 PM 06/13/2021

## 2021-06-16 LAB — CYTOLOGY - PAP
Comment: NEGATIVE
Diagnosis: NEGATIVE
Diagnosis: REACTIVE
High risk HPV: NEGATIVE

## 2021-06-28 NOTE — Progress Notes (Deleted)
GYNECOLOGY  VISIT   HPI: 66 y.o.   Widowed White or Caucasian Not Hispanic or Latino  female   G1P0010 with No LMP recorded. Patient is postmenopausal.   here for PMB  GYNECOLOGIC HISTORY: No LMP recorded. Patient is postmenopausal. Contraception: PMP Menopausal hormone therapy: none        OB History     Gravida  1   Para      Term      Preterm      AB  1   Living         SAB      IAB  1   Ectopic      Multiple      Live Births                 Patient Active Problem List   Diagnosis Date Noted   Hyperlipidemia 09/22/2020   Vitamin D deficiency 03/24/2020   Hypothyroid 09/04/2018   Physical exam 06/19/2017   Obesity (BMI 30-39.9) 06/19/2017   Chronic cough 03/09/2017    Past Medical History:  Diagnosis Date   Allergy    Chronic bronchitis (HCC)    GERD (gastroesophageal reflux disease)    History of shingles    History of UTI    Thyroid disease     Past Surgical History:  Procedure Laterality Date   TONSILLECTOMY AND ADENOIDECTOMY      Current Outpatient Medications  Medication Sig Dispense Refill   acetaZOLAMIDE (DIAMOX) 125 MG tablet Take 1 tablet (125 mg total) by mouth 3 (three) times daily. Starting 2 days prior to reaching high altitude and can stop after 2 days (Patient not taking: Reported on 06/13/2021) 90 tablet 0   albuterol (VENTOLIN HFA) 108 (90 Base) MCG/ACT inhaler Inhale 2 puffs into the lungs every 6 (six) hours as needed for wheezing or shortness of breath. 8 g 3   cetirizine (ZYRTEC) 10 MG tablet Take 10 mg by mouth daily.     levothyroxine (SYNTHROID) 100 MCG tablet Take 1 tablet (100 mcg total) by mouth daily. 90 tablet 3   omeprazole (PRILOSEC) 10 MG capsule Take 10 mg by mouth daily.     rosuvastatin (CRESTOR) 10 MG tablet TAKE 1 TABLET(10 MG) BY MOUTH DAILY 90 tablet 0   SYMBICORT 80-4.5 MCG/ACT inhaler INHALE 2 PUFFS INTO THE LUNGS TWICE DAILY 10.2 g 3   No current facility-administered medications for this visit.      ALLERGIES: Patient has no known allergies.  Family History  Problem Relation Age of Onset   Cancer Mother    Hypertension Mother    Asthma Mother    Miscarriages / [place] Mother    Breast cancer Mother 1   Parkinson's disease Mother    Emphysema Father    Early death Father    Hyperlipidemia Brother    Cancer Maternal Grandmother        ? type   Alzheimer's disease Paternal Grandmother    Cancer Paternal Grandfather        ? type   Heart disease Paternal Grandfather    Hyperlipidemia Paternal Grandfather    Diabetes Paternal Grandfather    Colon cancer Neg Hx    Esophageal cancer Neg Hx    Stomach cancer Neg Hx    Rectal cancer Neg Hx     Social History   Socioeconomic History   Marital status: Widowed    Spouse name: Not on file   Number of children: Not on file  Years of education: Not on file   Highest education level: Not on file  Occupational History   Not on file  Tobacco Use   Smoking status: Never   Smokeless tobacco: Never  Vaping Use   Vaping Use: Never used  Substance and Sexual Activity   Alcohol use: Yes    Alcohol/week: 3.0 standard drinks    Types: 3 Standard drinks or equivalent per week   Drug use: No   Sexual activity: Not Currently  Other Topics Concern   Not on file  Social History Narrative   Not on file   Social Determinants of Health   Financial Resource Strain: Not on file  Food Insecurity: Not on file  Transportation Needs: Not on file  Physical Activity: Not on file  Stress: Not on file  Social Connections: Not on file  Intimate Partner Violence: Not on file    ROS  PHYSICAL EXAMINATION:    There were no vitals taken for this visit.    General appearance: alert, cooperative and appears stated age Neck: no adenopathy, supple, symmetrical, trachea midline and thyroid {CHL AMB PHY EX THYROID NORM DEFAULT:[phone]::"normal to inspection and palpation"} Breasts: {Exam; breast:13139::"normal appearance, no  masses or tenderness"} Abdomen: soft, non-tender; non distended, no masses,  no organomegaly  Pelvic: External genitalia:  no lesions              Urethra:  normal appearing urethra with no masses, tenderness or lesions              Bartholins and Skenes: normal                 Vagina: normal appearing vagina with normal color and discharge, no lesions              Cervix: {CHL AMB PHY EX CERVIX NORM DEFAULT:[phone]::"no lesions"}              Bimanual Exam:  Uterus:  {CHL AMB PHY EX UTERUS NORM [phone]::"normal size, contour, position, consistency, mobility, non-tender"}              Adnexa: {CHL AMB PHY EX ADNEXA NO MASS [phone]::"no mass, fullness, tenderness"}              Rectovaginal: {yes no:[phone]}.  Confirms.              Anus:  normal sphincter tone, no lesions  Chaperone was present for exam.  ASSESSMENT     PLAN    An After Visit Summary was printed and given to the patient.  *** minutes face to face time of which over 50% was spent in counseling.

## 2021-07-07 ENCOUNTER — Other Ambulatory Visit: Payer: PRIVATE HEALTH INSURANCE

## 2021-07-07 ENCOUNTER — Other Ambulatory Visit: Payer: PRIVATE HEALTH INSURANCE | Admitting: Obstetrics and Gynecology

## 2021-08-11 ENCOUNTER — Other Ambulatory Visit: Payer: PRIVATE HEALTH INSURANCE | Admitting: Obstetrics and Gynecology

## 2021-08-11 ENCOUNTER — Other Ambulatory Visit: Payer: PRIVATE HEALTH INSURANCE

## 2021-09-05 ENCOUNTER — Telehealth: Payer: Self-pay

## 2021-09-05 NOTE — Telephone Encounter (Signed)
Patient left a message to cancel her ultrasound. Stated on the message that she did not want to reschedule and would call back when she decides when to reschedule it later on. I called patient back to confirm cancellation and reason for it but no answer. Please update provider ?

## 2021-09-05 NOTE — Telephone Encounter (Signed)
TW pt put on your schedule w/ [place] for PMB. Initially scheduled for 07/07/21, pt r/s for 08/11/21, then r/s for 09/08/21 and now is not wanting to r/s.  ?

## 2021-09-05 NOTE — Telephone Encounter (Signed)
Left message to call Charlise Giovanetti, RN at [facility], [phone], OPT 5.  

## 2021-09-05 NOTE — Telephone Encounter (Signed)
Please call the patient and explain the importance of  evaluation of PMP bleeding. Even one spot of bleeding needs to be evaluated.  ?

## 2021-09-08 ENCOUNTER — Other Ambulatory Visit: Payer: PRIVATE HEALTH INSURANCE

## 2021-09-08 ENCOUNTER — Other Ambulatory Visit: Payer: PRIVATE HEALTH INSURANCE | Admitting: Obstetrics and Gynecology

## 2021-09-29 ENCOUNTER — Other Ambulatory Visit: Payer: Self-pay

## 2021-09-29 DIAGNOSIS — E785 Hyperlipidemia, unspecified: Secondary | ICD-10-CM

## 2021-09-29 MED ORDER — ROSUVASTATIN CALCIUM 10 MG PO TABS: 10.0000 mg | ORAL_TABLET | Freq: Every day | ORAL | 3 refills | Status: DC

## 2021-09-29 NOTE — Progress Notes (Signed)
Ro

## 2021-09-30 NOTE — Telephone Encounter (Signed)
Left message to call Brentley Horrell, RN at [facility], [phone].  

## 2021-10-06 NOTE — Telephone Encounter (Signed)
No return call from patient.   Dr. Talbert Nan -please advise.

## 2021-10-10 NOTE — Telephone Encounter (Signed)
Please send the patient a letter letting her know that postmenopausal bleeding can be a sign of endometrial cancer, and that we would not recommend any further delay in evaluation. Then close the encounter.

## 2021-10-10 NOTE — Telephone Encounter (Signed)
Standard Letter completed and mailed.   Routing to provider for final review. Patient is agreeable to disposition. Will close encounter. '

## 2021-12-08 DIAGNOSIS — H2513 Age-related nuclear cataract, bilateral: Secondary | ICD-10-CM | POA: Diagnosis not present

## 2021-12-08 DIAGNOSIS — H43811 Vitreous degeneration, right eye: Secondary | ICD-10-CM | POA: Diagnosis not present

## 2022-02-17 ENCOUNTER — Other Ambulatory Visit: Payer: Self-pay

## 2022-02-17 MED ORDER — BUDESONIDE-FORMOTEROL FUMARATE 80-4.5 MCG/ACT IN AERO: 2.0000 | INHALATION_SPRAY | Freq: Two times a day (BID) | RESPIRATORY_TRACT | 3 refills | Status: DC

## 2022-05-16 ENCOUNTER — Encounter: Payer: Self-pay | Admitting: Family Medicine

## 2022-05-17 ENCOUNTER — Other Ambulatory Visit: Payer: Self-pay

## 2022-05-17 DIAGNOSIS — E039 Hypothyroidism, unspecified: Secondary | ICD-10-CM

## 2022-05-17 MED ORDER — LEVOTHYROXINE SODIUM 100 MCG PO TABS: 100.0000 ug | ORAL_TABLET | Freq: Every day | ORAL | 0 refills | Status: DC

## 2022-05-18 ENCOUNTER — Other Ambulatory Visit: Payer: Self-pay

## 2022-05-18 DIAGNOSIS — E039 Hypothyroidism, unspecified: Secondary | ICD-10-CM

## 2022-05-18 MED ORDER — LEVOTHYROXINE SODIUM 100 MCG PO TABS: 100.0000 ug | ORAL_TABLET | Freq: Every day | ORAL | 0 refills | Status: DC

## 2022-06-14 DIAGNOSIS — M1711 Unilateral primary osteoarthritis, right knee: Secondary | ICD-10-CM | POA: Diagnosis not present

## 2022-07-12 DIAGNOSIS — M1711 Unilateral primary osteoarthritis, right knee: Secondary | ICD-10-CM | POA: Diagnosis not present

## 2022-07-14 ENCOUNTER — Other Ambulatory Visit: Payer: Self-pay | Admitting: Family Medicine

## 2022-07-14 ENCOUNTER — Other Ambulatory Visit: Payer: Self-pay

## 2022-07-14 DIAGNOSIS — E039 Hypothyroidism, unspecified: Secondary | ICD-10-CM

## 2022-07-14 MED ORDER — LEVOTHYROXINE SODIUM 100 MCG PO TABS: 100.0000 ug | ORAL_TABLET | Freq: Every day | ORAL | 0 refills | Status: DC

## 2022-07-14 MED ORDER — LEVOTHYROXINE SODIUM 100 MCG PO TABS: 100.0000 ug | ORAL_TABLET | Freq: Every day | ORAL | 3 refills | Status: DC

## 2022-07-19 ENCOUNTER — Encounter: Payer: No Typology Code available for payment source | Admitting: Family Medicine

## 2022-09-06 ENCOUNTER — Ambulatory Visit (INDEPENDENT_AMBULATORY_CARE_PROVIDER_SITE_OTHER): Payer: PPO | Admitting: Family Medicine

## 2022-09-06 ENCOUNTER — Encounter: Payer: Self-pay | Admitting: Family Medicine

## 2022-09-06 VITALS — BP 126/80 | HR 116 | Temp 98.8°F | Resp 17 | Ht 68.0 in | Wt 221.5 lb

## 2022-09-06 DIAGNOSIS — E785 Hyperlipidemia, unspecified: Secondary | ICD-10-CM

## 2022-09-06 DIAGNOSIS — Z23 Encounter for immunization: Secondary | ICD-10-CM | POA: Diagnosis not present

## 2022-09-06 DIAGNOSIS — Z0000 Encounter for general adult medical examination without abnormal findings: Secondary | ICD-10-CM

## 2022-09-06 DIAGNOSIS — E559 Vitamin D deficiency, unspecified: Secondary | ICD-10-CM | POA: Diagnosis not present

## 2022-09-06 MED ORDER — DULERA 100-5 MCG/ACT IN AERO: 2.0000 | INHALATION_SPRAY | Freq: Two times a day (BID) | RESPIRATORY_TRACT | 6 refills | Status: DC

## 2022-09-06 NOTE — Patient Instructions (Signed)
Follow up in 6 months to recheck cholesterol We'll notify you of your lab results and make any changes if needed Try and eat a low carb diet w/ increased fruits and veggies Portion out your snacks prior to eating Try and get regular physical activity- goal is 30 minutes/day Call with any questions or concerns Stay Safe!  Stay Healthy! Happy [employer] Birthday!!

## 2022-09-06 NOTE — Progress Notes (Unsigned)
   Subjective:    Patient ID: Michele Chan, female    DOB: 06-11-1955, 67 y.o.   MRN: [id number]  HPI CPE- due for mammo (declines), cologuard (declines).  Due for Prevnar 20.  UTD on Tdap.  Patient Care Team    Relationship Specialty Notifications Start End  Sheliah Hatch, MD PCP - General Family Medicine  03/03/17      Health Maintenance  Topic Date Due   MAMMOGRAM  07/11/2019   Fecal DNA (Cologuard)  07/03/2020   Pneumonia Vaccine 51+ Years old (1 of 1 - PCV) Never done   DEXA SCAN  Never done   INFLUENZA VACCINE  12/21/2022   DTaP/Tdap/Td (2 - Td or Tdap) 06/20/2027   HPV VACCINES  Aged Out   COVID-19 Vaccine  Discontinued   Hepatitis C Screening  Discontinued   Zoster Vaccines- Shingrix  Discontinued      Review of Systems Patient reports no vision/ hearing changes, adenopathy,fever, weight change,  persistant/recurrent hoarseness , swallowing issues, chest pain, palpitations, edema, persistant/recurrent cough, hemoptysis, dyspnea (rest/exertional/paroxysmal nocturnal), gastrointestinal bleeding (melena, rectal bleeding), abdominal pain, significant heartburn, bowel changes, GU symptoms (dysuria, hematuria, incontinence), Gyn symptoms (abnormal  bleeding, pain),  syncope, focal weakness, memory loss, numbness & tingling, skin/hair/nail changes, abnormal bruising or bleeding, anxiety, or depression.     Objective:   Physical Exam General Appearance:    Alert, cooperative, no distress, appears stated age, obese  Head:    Normocephalic, without obvious abnormality, atraumatic  Eyes:    PERRL, conjunctiva/corneas clear, EOM's intact both eyes  Ears:    Normal TM's and external ear canals, both ears  Nose:   Nares normal, septum midline, mucosa normal, no drainage    or sinus tenderness  Throat:   Lips, mucosa, and tongue normal; teeth and gums normal  Neck:   Supple, symmetrical, trachea midline, no adenopathy;    Thyroid: no enlargement/tenderness/nodules  Back:      Symmetric, no curvature, ROM normal, no CVA tenderness  Lungs:     Clear to auscultation bilaterally, respirations unlabored  Chest Wall:    No tenderness or deformity   Heart:    Regular rate and rhythm, S1 and S2 normal, no murmur, rub   or gallop  Breast Exam:    Deferred  Abdomen:     Soft, non-tender, bowel sounds active all four quadrants,    no masses, no organomegaly  Genitalia:    Deferred  Rectal:    Extremities:   Extremities normal, atraumatic, no cyanosis or edema  Pulses:   2+ and symmetric all extremities  Skin:   Skin color, texture, turgor normal, no rashes or lesions  Lymph nodes:   Cervical, supraclavicular, and axillary nodes normal  Neurologic:   CNII-XII intact, normal strength, sensation and reflexes    throughout          Assessment & Plan:

## 2022-09-07 LAB — BASIC METABOLIC PANEL
BUN: 21 mg/dL (ref 6–23)
CO2: 27 mEq/L (ref 19–32)
Calcium: 10.1 mg/dL (ref 8.4–10.5)
Chloride: 99 mEq/L (ref 96–112)
Creatinine, Ser: 0.8 mg/dL (ref 0.40–1.20)
GFR: 76.97 mL/min (ref >60.00)
Glucose, Bld: 73 mg/dL (ref 70–99)
Potassium: 4.4 mEq/L (ref 3.5–5.1)
Sodium: 137 mEq/L (ref 135–145)

## 2022-09-07 LAB — CBC WITH DIFFERENTIAL/PLATELET
Basophils Absolute: 0.1 10*3/uL (ref 0.0–0.1)
Basophils Relative: 0.9 % (ref 0.0–3.0)
Eosinophils Absolute: 0.5 10*3/uL (ref 0.0–0.7)
Eosinophils Relative: 6.5 % — ABNORMAL HIGH (ref 0.0–5.0)
HCT: 41.2 % (ref 36.0–46.0)
Hemoglobin: 14.1 g/dL (ref 12.0–15.0)
Lymphocytes Relative: 35.8 % (ref 12.0–46.0)
Lymphs Abs: 2.8 10*3/uL (ref 0.7–4.0)
MCHC: 34.2 g/dL (ref 30.0–36.0)
MCV: 94.7 fl (ref 78.0–100.0)
Monocytes Absolute: 0.6 10*3/uL (ref 0.1–1.0)
Monocytes Relative: 7.3 % (ref 3.0–12.0)
Neutro Abs: 3.8 10*3/uL (ref 1.4–7.7)
Neutrophils Relative %: 49.5 % (ref 43.0–77.0)
Platelets: 252 10*3/uL (ref 150.0–400.0)
RBC: 4.35 Mil/uL (ref 3.87–5.11)
RDW: 13.3 % (ref 11.5–15.5)
WBC: 7.7 10*3/uL (ref 4.0–10.5)

## 2022-09-07 LAB — LIPID PANEL
Cholesterol: 179 mg/dL (ref 0–200)
HDL: 68.5 mg/dL (ref >39.00)
LDL Cholesterol: 95 mg/dL (ref 0–99)
NonHDL: 110.74
Total CHOL/HDL Ratio: 3
Triglycerides: 79 mg/dL (ref 0.0–149.0)
VLDL: 15.8 mg/dL (ref 0.0–40.0)

## 2022-09-07 LAB — HEPATIC FUNCTION PANEL
ALT: 16 U/L (ref 0–35)
AST: 17 U/L (ref 0–37)
Albumin: 4.7 g/dL (ref 3.5–5.2)
Alkaline Phosphatase: 67 U/L (ref 39–117)
Bilirubin, Direct: 0 mg/dL (ref 0.0–0.3)
Total Bilirubin: 0.5 mg/dL (ref 0.2–1.2)
Total Protein: 7.9 g/dL (ref 6.0–8.3)

## 2022-09-07 LAB — VITAMIN D 25 HYDROXY (VIT D DEFICIENCY, FRACTURES): VITD: 19.48 ng/mL — ABNORMAL LOW (ref 30.00–100.00)

## 2022-09-07 LAB — TSH: TSH: 0.77 u[IU]/mL (ref 0.35–5.50)

## 2022-09-07 NOTE — Assessment & Plan Note (Signed)
Pt's PE WNL w/ exception of BMI.  Declines mammo and cologuard.  Prevnar 20 given.  Check labs.  Anticipatory guidance provided.

## 2022-09-07 NOTE — Assessment & Plan Note (Signed)
Check labs and replete prn. 

## 2022-09-07 NOTE — Assessment & Plan Note (Signed)
Ongoing issue.  Check labs and adjust tx plan prn.

## 2022-09-08 ENCOUNTER — Other Ambulatory Visit: Payer: Self-pay

## 2022-09-08 DIAGNOSIS — E509 Vitamin A deficiency, unspecified: Secondary | ICD-10-CM

## 2022-09-08 MED ORDER — VITAMIN D (ERGOCALCIFEROL) 1.25 MG (50000 UNIT) PO CAPS
50000.0000 [IU] | ORAL_CAPSULE | ORAL | 0 refills | Status: DC
Start: 2022-09-08 — End: 2023-03-08

## 2022-09-12 ENCOUNTER — Telehealth: Payer: Self-pay | Admitting: Family Medicine

## 2022-09-12 NOTE — Telephone Encounter (Signed)
Called patient to schedule Medicare Annual Wellness Visit (AWV). Left message for patient to call back and schedule Medicare Annual Wellness Visit (AWV).  Last date of AWV: AWVI eligible as of 08/21/2022   Please schedule an AWVI appointment at any time with [facility] ANNUAL WELLNESS VISIT.  If any questions, please contact me at [phone].    Thank you,  Burnice Vassel  Ambulatory Clinic Support [facility] Direct dial  [phone]   

## 2022-09-13 DIAGNOSIS — M1711 Unilateral primary osteoarthritis, right knee: Secondary | ICD-10-CM | POA: Diagnosis not present

## 2022-09-20 ENCOUNTER — Encounter: Payer: Self-pay | Admitting: Gastroenterology

## 2022-09-26 ENCOUNTER — Telehealth: Payer: Self-pay

## 2022-09-26 NOTE — Telephone Encounter (Signed)
PA needed for patients Elwin Sleight will need to call pharmacy as the Cover My Meds form is not processing correctly

## 2022-10-03 ENCOUNTER — Telehealth: Payer: Self-pay | Admitting: Family Medicine

## 2022-10-03 NOTE — Telephone Encounter (Signed)
I called patient to schedule Annual Wellness Visit.  Patient said she's upset because at her last visit she brought in her Insurance card, [employer]. I looked in Epic and the card was scanned, but not put in Epic. Epic shows her having [employer]. Patient said she hasn't had [facility] [facility] for 3 years. She said she's been billed over $1,000 and she's thinking about finding a new doctor. Can you help her get this fixed?

## 2022-10-05 NOTE — Telephone Encounter (Signed)
Michele Chan,  We can definitely help! I will partner with the front desk staff that checked her in to bring it to their attention and make sure we're looking at the insurance and putting in the correct info. Thank you for letting me know.   [name],  This patient's insurance is incorrect in Epic. Both Medicare and [employer] cards were scanned in but not entered in the coverage portion of the patient's registration information. They were also not addressed on the check list during check in. The Health team advantage card was also scanned in under [facility].

## 2022-10-13 ENCOUNTER — Telehealth: Payer: Self-pay

## 2022-10-13 ENCOUNTER — Encounter: Payer: Self-pay | Admitting: Family Medicine

## 2022-10-13 NOTE — Telephone Encounter (Signed)
Sent message to  Pharmacy team to obtain

## 2022-10-13 NOTE — Telephone Encounter (Signed)
The Symbicort was D/C and we had sent in [facility] in April would you like a PA for this ?

## 2022-10-13 NOTE — Telephone Encounter (Signed)
Pt sent a my chart message stating she needs a PA for her Symbicort inhaler which I have forward to the pharmacy team , but she is wanting to know what can she do in the mean time ? What do you recommend?

## 2022-10-13 NOTE — Telephone Encounter (Signed)
PA For Michele Chan is needed

## 2022-10-13 NOTE — Telephone Encounter (Signed)
Pt is asking if we can obtain a PA for her Symbicort inhaler ?

## 2022-10-13 NOTE — Telephone Encounter (Signed)
Please start Prior Auth on [facility]

## 2022-10-13 NOTE — Telephone Encounter (Signed)
She should call her insurance company or her insurance and see if there is a preferred alternative

## 2022-10-13 NOTE — Telephone Encounter (Signed)
Per charts, Symbicort has been discontinued. Received PA request for [facility]. Please advise which PA is needed.

## 2022-10-14 ENCOUNTER — Other Ambulatory Visit (HOSPITAL_COMMUNITY): Payer: Self-pay

## 2022-10-17 ENCOUNTER — Other Ambulatory Visit (HOSPITAL_COMMUNITY): Payer: Self-pay

## 2022-10-17 ENCOUNTER — Telehealth: Payer: Self-pay

## 2022-10-17 NOTE — Telephone Encounter (Signed)
Pharmacy Patient Advocate Encounter  Received notification from [employer] that the request for prior authorization for [facility] has been denied due to criteria not being met.       Please be advised we currently do not have a Pharmacist to review denials, therefore you will need to process appeals accordingly as needed. Thanks for your support at this time.   You may call [phone] or fax [phone], to appeal.

## 2022-10-17 NOTE — Telephone Encounter (Signed)
PA submitted via CMM. Created new encounter for PA. Will route back to pool once determination has been made. 

## 2022-10-17 NOTE — Telephone Encounter (Signed)
Pharmacy Patient Advocate Encounter   Received notification from [facility] that prior authorization for [facility] 100-5MCG/ACT aerosol is required/requested.   PA submitted on 10/17/22 to (ins)  [employer]  via CoverMyMeds Key or [facility]) confirmation #  [id number] Status is pending

## 2022-10-17 NOTE — Telephone Encounter (Signed)
DX code R05.3

## 2022-10-17 NOTE — Telephone Encounter (Signed)
Please provide diagnosis (code) for PA.

## 2022-10-17 NOTE — Telephone Encounter (Signed)
I have informed pt of the approval

## 2022-10-27 ENCOUNTER — Other Ambulatory Visit: Payer: Self-pay

## 2022-10-27 DIAGNOSIS — R053 Chronic cough: Secondary | ICD-10-CM

## 2022-10-27 MED ORDER — DULERA 100-5 MCG/ACT IN AERO
2.0000 | INHALATION_SPRAY | Freq: Two times a day (BID) | RESPIRATORY_TRACT | 6 refills | Status: DC
Start: 2022-10-27 — End: 2022-10-31

## 2022-10-30 ENCOUNTER — Encounter: Payer: Self-pay | Admitting: Family Medicine

## 2022-10-31 MED ORDER — FLUTICASONE FUROATE-VILANTEROL 100-25 MCG/ACT IN AEPB
1.0000 | INHALATION_SPRAY | Freq: Every day | RESPIRATORY_TRACT | 11 refills | Status: DC
Start: 2022-10-31 — End: 2023-11-28

## 2022-10-31 NOTE — Telephone Encounter (Signed)
Prescription for Breo sent to local pharmacy in accordance w/ insurance requirements

## 2022-10-31 NOTE — Addendum Note (Signed)
Addended by: Sheliah Hatch on: 10/31/2022 08:18 AM   Modules accepted: Orders

## 2022-10-31 NOTE — Telephone Encounter (Signed)
Spoke to the pt on phone and Michele Chan has been approved insurance will not cover Dulera and pt is aware . Michele Chan has been sent in

## 2022-10-31 NOTE — Telephone Encounter (Signed)
Pt's insurance is not covering the [facility] they approved the Breo inhaler . Can we send this in ?

## 2022-11-21 ENCOUNTER — Other Ambulatory Visit: Payer: Self-pay

## 2022-11-21 DIAGNOSIS — E785 Hyperlipidemia, unspecified: Secondary | ICD-10-CM

## 2022-11-21 MED ORDER — ROSUVASTATIN CALCIUM 10 MG PO TABS
10.0000 mg | ORAL_TABLET | Freq: Every day | ORAL | 1 refills | Status: DC
Start: 2022-11-21 — End: 2023-05-15

## 2022-12-04 DIAGNOSIS — M1711 Unilateral primary osteoarthritis, right knee: Secondary | ICD-10-CM | POA: Diagnosis not present

## 2022-12-14 ENCOUNTER — Other Ambulatory Visit: Payer: Self-pay

## 2022-12-14 DIAGNOSIS — E039 Hypothyroidism, unspecified: Secondary | ICD-10-CM

## 2022-12-14 MED ORDER — LEVOTHYROXINE SODIUM 100 MCG PO TABS
100.0000 ug | ORAL_TABLET | Freq: Every day | ORAL | 3 refills | Status: DC
Start: 2022-12-14 — End: 2023-04-13

## 2023-03-08 ENCOUNTER — Ambulatory Visit: Payer: PPO | Admitting: Family Medicine

## 2023-03-08 ENCOUNTER — Encounter: Payer: Self-pay | Admitting: Family Medicine

## 2023-03-08 VITALS — BP 124/78 | HR 67 | Temp 98.1°F | Ht 68.0 in | Wt 233.5 lb

## 2023-03-08 DIAGNOSIS — Z1211 Encounter for screening for malignant neoplasm of colon: Secondary | ICD-10-CM

## 2023-03-08 DIAGNOSIS — E785 Hyperlipidemia, unspecified: Secondary | ICD-10-CM

## 2023-03-08 DIAGNOSIS — E039 Hypothyroidism, unspecified: Secondary | ICD-10-CM | POA: Diagnosis not present

## 2023-03-08 DIAGNOSIS — Z1159 Encounter for screening for other viral diseases: Secondary | ICD-10-CM | POA: Diagnosis not present

## 2023-03-08 DIAGNOSIS — E669 Obesity, unspecified: Secondary | ICD-10-CM | POA: Diagnosis not present

## 2023-03-08 DIAGNOSIS — Z6835 Body mass index (BMI) 35.0-35.9, adult: Secondary | ICD-10-CM | POA: Diagnosis not present

## 2023-03-08 DIAGNOSIS — Z23 Encounter for immunization: Secondary | ICD-10-CM

## 2023-03-08 NOTE — Assessment & Plan Note (Signed)
Chronic problem.  Currently on Crestor 10mg  daily w/o difficulty.  Check labs.  Adjust meds prn

## 2023-03-08 NOTE — Assessment & Plan Note (Signed)
Deteriorated.  Pt has gained 12 lbs since last visit.  Reports that now that weather has cooled, she intends to start exercising more regularly.  Check labs to risk stratify.  Will follow.

## 2023-03-08 NOTE — Patient Instructions (Signed)
Schedule your complete physical in 6 months We'll notify you of your lab results and make any changes if needed Continue to work on healthy diet and regular exercise- you can do it!! Call with any questions or concerns Stay Safe!  Stay Healthy! ENJOY YOUR TRAVELS!!!

## 2023-03-08 NOTE — Progress Notes (Signed)
   Subjective:    Patient ID: Michele Chan, female    DOB: May 20, 1956, 67 y.o.   MRN: [id number]  HPI Hyperlipidemia- chronic problem, on Crestor 10mg  daily.  No CP, SOB, abd pain, N/V.  Hypothyroid- chronic problem, currently on Levothyroxine daily.  No changes to skin or nails.  Pt reports hair is thinning  Obesity- pt has gained 12 lbs since last visit.  Pt reports 'i am trying to do better'.  Plans on being more active now that weather has cooled.   Review of Systems For ROS see HPI     Objective:   Physical Exam Vitals reviewed.  Constitutional:      General: She is not in acute distress.    Appearance: Normal appearance. She is well-developed. She is obese. She is not ill-appearing.  HENT:     Head: Normocephalic and atraumatic.  Eyes:     Conjunctiva/sclera: Conjunctivae normal.     Pupils: Pupils are equal, round, and reactive to light.  Neck:     Thyroid: No thyromegaly.  Cardiovascular:     Rate and Rhythm: Normal rate and regular rhythm.     Pulses: Normal pulses.     Heart sounds: Normal heart sounds. No murmur heard. Pulmonary:     Effort: Pulmonary effort is normal. No respiratory distress.     Breath sounds: Normal breath sounds.  Abdominal:     General: There is no distension.     Palpations: Abdomen is soft.     Tenderness: There is no abdominal tenderness.  Musculoskeletal:     Cervical back: Normal range of motion and neck supple.     Right lower leg: No edema.     Left lower leg: No edema.  Lymphadenopathy:     Cervical: No cervical adenopathy.  Skin:    General: Skin is warm and dry.  Neurological:     General: No focal deficit present.     Mental Status: She is alert and oriented to person, place, and time.  Psychiatric:        Mood and Affect: Mood normal.        Behavior: Behavior normal.        Thought Content: Thought content normal.           Assessment & Plan:

## 2023-03-08 NOTE — Assessment & Plan Note (Signed)
Chronic problem.  Currently on Levothyroxine daily.  Is experiencing hair thinning.  Mom had this issue later in life.  Check labs.  Adjust meds prn

## 2023-03-09 LAB — CBC WITH DIFFERENTIAL/PLATELET
Basophils Absolute: 0.1 10*3/uL (ref 0.0–0.1)
Basophils Relative: 2 % (ref 0.0–3.0)
Eosinophils Absolute: 0.3 10*3/uL (ref 0.0–0.7)
Eosinophils Relative: 4.7 % (ref 0.0–5.0)
HCT: 41.3 % (ref 36.0–46.0)
Hemoglobin: 13.3 g/dL (ref 12.0–15.0)
Lymphocytes Relative: 34.2 % (ref 12.0–46.0)
Lymphs Abs: 2.3 10*3/uL (ref 0.7–4.0)
MCHC: 32.3 g/dL (ref 30.0–36.0)
MCV: 97.1 fL (ref 78.0–100.0)
Monocytes Absolute: 0.6 10*3/uL (ref 0.1–1.0)
Monocytes Relative: 8.4 % (ref 3.0–12.0)
Neutro Abs: 3.3 10*3/uL (ref 1.4–7.7)
Neutrophils Relative %: 50.7 % (ref 43.0–77.0)
Platelets: 273 10*3/uL (ref 150.0–400.0)
RBC: 4.25 Mil/uL (ref 3.87–5.11)
RDW: 13.7 % (ref 11.5–15.5)
WBC: 6.6 10*3/uL (ref 4.0–10.5)

## 2023-03-09 LAB — BASIC METABOLIC PANEL
BUN: 16 mg/dL (ref 6–23)
CO2: 28 meq/L (ref 19–32)
Calcium: 10 mg/dL (ref 8.4–10.5)
Chloride: 100 meq/L (ref 96–112)
Creatinine, Ser: 0.76 mg/dL (ref 0.40–1.20)
GFR: 81.57 mL/min (ref >60.00)
Glucose, Bld: 81 mg/dL (ref 70–99)
Potassium: 4.7 meq/L (ref 3.5–5.1)
Sodium: 137 meq/L (ref 135–145)

## 2023-03-09 LAB — LIPID PANEL
Cholesterol: 156 mg/dL (ref 0–200)
HDL: 70.8 mg/dL (ref >39.00)
LDL Cholesterol: 69 mg/dL (ref 0–99)
NonHDL: 85.43
Total CHOL/HDL Ratio: 2
Triglycerides: 83 mg/dL (ref 0.0–149.0)
VLDL: 16.6 mg/dL (ref 0.0–40.0)

## 2023-03-09 LAB — HEPATIC FUNCTION PANEL
ALT: 17 U/L (ref 0–35)
AST: 18 U/L (ref 0–37)
Albumin: 4.6 g/dL (ref 3.5–5.2)
Alkaline Phosphatase: 68 U/L (ref 39–117)
Bilirubin, Direct: 0.1 mg/dL (ref 0.0–0.3)
Total Bilirubin: 0.5 mg/dL (ref 0.2–1.2)
Total Protein: 7.7 g/dL (ref 6.0–8.3)

## 2023-03-09 LAB — TSH: TSH: 1.81 u[IU]/mL (ref 0.35–5.50)

## 2023-03-09 LAB — HEPATITIS C ANTIBODY: Hepatitis C Ab: NONREACTIVE

## 2023-03-12 ENCOUNTER — Telehealth: Payer: Self-pay

## 2023-03-12 NOTE — Telephone Encounter (Signed)
-----   Message from Neena Rhymes sent at 03/12/2023  7:32 AM EDT ----- Labs look great!  No changes at this time

## 2023-04-09 DIAGNOSIS — M1711 Unilateral primary osteoarthritis, right knee: Secondary | ICD-10-CM | POA: Diagnosis not present

## 2023-04-13 ENCOUNTER — Other Ambulatory Visit: Payer: Self-pay | Admitting: Family Medicine

## 2023-04-13 DIAGNOSIS — E039 Hypothyroidism, unspecified: Secondary | ICD-10-CM

## 2023-05-15 ENCOUNTER — Other Ambulatory Visit: Payer: Self-pay

## 2023-05-15 DIAGNOSIS — E785 Hyperlipidemia, unspecified: Secondary | ICD-10-CM

## 2023-05-15 MED ORDER — ROSUVASTATIN CALCIUM 10 MG PO TABS: 10.0000 mg | ORAL_TABLET | Freq: Every day | ORAL | 1 refills | Status: DC

## 2023-07-09 DIAGNOSIS — M1711 Unilateral primary osteoarthritis, right knee: Secondary | ICD-10-CM | POA: Diagnosis not present

## 2023-08-19 ENCOUNTER — Other Ambulatory Visit: Payer: Self-pay | Admitting: Family Medicine

## 2023-08-19 DIAGNOSIS — E039 Hypothyroidism, unspecified: Secondary | ICD-10-CM

## 2023-08-26 ENCOUNTER — Emergency Department (HOSPITAL_COMMUNITY)

## 2023-08-26 ENCOUNTER — Ambulatory Visit
Admission: RE | Admit: 2023-08-26 | Discharge: 2023-08-26 | Disposition: A | Discharge status: Home / Self Care | Source: Ambulatory Visit | Attending: Physician Assistant | Admitting: Physician Assistant

## 2023-08-26 ENCOUNTER — Other Ambulatory Visit: Payer: Self-pay

## 2023-08-26 ENCOUNTER — Encounter (HOSPITAL_COMMUNITY): Payer: Self-pay

## 2023-08-26 ENCOUNTER — Observation Stay (HOSPITAL_COMMUNITY)
Admission: EM | Admit: 2023-08-26 | Discharge: 2023-08-28 | Disposition: A | Discharge status: Home / Self Care | Attending: Internal Medicine | Admitting: Internal Medicine

## 2023-08-26 VITALS — BP 120/79 | HR 79 | Temp 98.2°F | Resp 18 | Ht 68.0 in | Wt 230.0 lb

## 2023-08-26 DIAGNOSIS — M1990 Unspecified osteoarthritis, unspecified site: Secondary | ICD-10-CM | POA: Insufficient documentation

## 2023-08-26 DIAGNOSIS — J4489 Other specified chronic obstructive pulmonary disease: Secondary | ICD-10-CM | POA: Diagnosis not present

## 2023-08-26 DIAGNOSIS — K8020 Calculus of gallbladder without cholecystitis without obstruction: Secondary | ICD-10-CM | POA: Insufficient documentation

## 2023-08-26 DIAGNOSIS — E039 Hypothyroidism, unspecified: Secondary | ICD-10-CM | POA: Diagnosis present

## 2023-08-26 DIAGNOSIS — R0602 Shortness of breath: Secondary | ICD-10-CM | POA: Diagnosis not present

## 2023-08-26 DIAGNOSIS — E785 Hyperlipidemia, unspecified: Secondary | ICD-10-CM | POA: Diagnosis not present

## 2023-08-26 DIAGNOSIS — K219 Gastro-esophageal reflux disease without esophagitis: Secondary | ICD-10-CM | POA: Diagnosis not present

## 2023-08-26 DIAGNOSIS — K5900 Constipation, unspecified: Secondary | ICD-10-CM

## 2023-08-26 DIAGNOSIS — R10819 Abdominal tenderness, unspecified site: Secondary | ICD-10-CM | POA: Diagnosis present

## 2023-08-26 DIAGNOSIS — Z6834 Body mass index (BMI) 34.0-34.9, adult: Secondary | ICD-10-CM | POA: Insufficient documentation

## 2023-08-26 DIAGNOSIS — E66812 Obesity, class 2: Secondary | ICD-10-CM | POA: Insufficient documentation

## 2023-08-26 DIAGNOSIS — K859 Acute pancreatitis without necrosis or infection, unspecified: Secondary | ICD-10-CM | POA: Diagnosis not present

## 2023-08-26 DIAGNOSIS — K5730 Diverticulosis of large intestine without perforation or abscess without bleeding: Secondary | ICD-10-CM | POA: Diagnosis not present

## 2023-08-26 DIAGNOSIS — R1011 Right upper quadrant pain: Secondary | ICD-10-CM

## 2023-08-26 DIAGNOSIS — K828 Other specified diseases of gallbladder: Secondary | ICD-10-CM | POA: Diagnosis not present

## 2023-08-26 DIAGNOSIS — K449 Diaphragmatic hernia without obstruction or gangrene: Secondary | ICD-10-CM | POA: Diagnosis not present

## 2023-08-26 DIAGNOSIS — K8021 Calculus of gallbladder without cholecystitis with obstruction: Secondary | ICD-10-CM

## 2023-08-26 DIAGNOSIS — K760 Fatty (change of) liver, not elsewhere classified: Secondary | ICD-10-CM | POA: Diagnosis not present

## 2023-08-26 LAB — URINALYSIS, ROUTINE W REFLEX MICROSCOPIC
Bacteria, UA: NONE SEEN
Bilirubin Urine: NEGATIVE
Glucose, UA: NEGATIVE mg/dL
Ketones, ur: 20 mg/dL — AB
Leukocytes,Ua: NEGATIVE
Nitrite: NEGATIVE
Protein, ur: NEGATIVE mg/dL
Specific Gravity, Urine: 1.028 (ref 1.005–1.030)
pH: 5 (ref 5.0–8.0)

## 2023-08-26 LAB — CBC
HCT: 44 % (ref 36.0–46.0)
Hemoglobin: 14.5 g/dL (ref 12.0–15.0)
MCH: 31.9 pg (ref 26.0–34.0)
MCHC: 33 g/dL (ref 30.0–36.0)
MCV: 96.7 fL (ref 80.0–100.0)
Platelets: 288 10*3/uL (ref 150–400)
RBC: 4.55 MIL/uL (ref 3.87–5.11)
RDW: 13.3 % (ref 11.5–15.5)
WBC: 12.4 10*3/uL — ABNORMAL HIGH (ref 4.0–10.5)
nRBC: 0 % (ref 0.0–0.2)

## 2023-08-26 LAB — LACTIC ACID, PLASMA: Lactic Acid, Venous: 0.8 mmol/L (ref 0.5–1.9)

## 2023-08-26 LAB — COMPREHENSIVE METABOLIC PANEL WITH GFR
ALT: 21 U/L (ref 0–44)
AST: 24 U/L (ref 15–41)
Albumin: 4.2 g/dL (ref 3.5–5.0)
Alkaline Phosphatase: 74 U/L (ref 38–126)
Anion gap: 11 (ref 5–15)
BUN: 16 mg/dL (ref 8–23)
CO2: 25 mmol/L (ref 22–32)
Calcium: 9.5 mg/dL (ref 8.9–10.3)
Chloride: 98 mmol/L (ref 98–111)
Creatinine, Ser: 0.76 mg/dL (ref 0.44–1.00)
GFR, Estimated: >60 mL/min (ref >60)
Glucose, Bld: 111 mg/dL — ABNORMAL HIGH (ref 70–99)
Potassium: 4.1 mmol/L (ref 3.5–5.1)
Sodium: 134 mmol/L — ABNORMAL LOW (ref 135–145)
Total Bilirubin: 1.2 mg/dL (ref 0.0–1.2)
Total Protein: 8.8 g/dL — ABNORMAL HIGH (ref 6.5–8.1)

## 2023-08-26 LAB — LIPASE, BLOOD: Lipase: 34 U/L (ref 11–51)

## 2023-08-26 LAB — TROPONIN I (HIGH SENSITIVITY)
Troponin I (High Sensitivity): <2 ng/L (ref <18)
Troponin I (High Sensitivity): <2 ng/L (ref <18)

## 2023-08-26 MED ORDER — MORPHINE SULFATE (PF) 4 MG/ML IV SOLN
4.0000 mg | Freq: Once | INTRAVENOUS | Status: AC
  Administered 2023-08-26: 4 mg via INTRAVENOUS
  Filled 2023-08-26: qty 1

## 2023-08-26 MED ORDER — HYDROMORPHONE HCL 1 MG/ML IJ SOLN
0.2000 mg | INTRAMUSCULAR | Status: DC | PRN
  Administered 2023-08-26: 0.2 mg via INTRAVENOUS
  Filled 2023-08-26: qty 0.5

## 2023-08-26 MED ORDER — IOHEXOL 300 MG/ML  SOLN
100.0000 mL | Freq: Once | INTRAMUSCULAR | Status: AC | PRN
  Administered 2023-08-26: 100 mL via INTRAVENOUS

## 2023-08-26 MED ORDER — LACTATED RINGERS IV SOLN: INTRAVENOUS | Status: AC

## 2023-08-26 MED ORDER — ONDANSETRON HCL 4 MG/2ML IJ SOLN
4.0000 mg | Freq: Once | INTRAMUSCULAR | Status: AC
  Administered 2023-08-26: 4 mg via INTRAVENOUS
  Filled 2023-08-26: qty 2

## 2023-08-26 MED ORDER — LACTATED RINGERS IV BOLUS
1000.0000 mL | Freq: Once | INTRAVENOUS | Status: AC
  Administered 2023-08-26: 1000 mL via INTRAVENOUS

## 2023-08-26 NOTE — ED Provider Notes (Signed)
 Patient's care assumed by me at 3:30 PM.  Patient is a 68 year old female who has had abdominal pain since Thursday.  Patient had a CT scan done here today for evaluation which shows pancreatitis on the CT as well as a gallstone.  Patient is pending abdominal ultrasound. Plan is admission for pancreatitis to hospitalist and GI consult for further evaluation given gallstone and pancreatitis. Hospitalist consulted for admission.   Osie Cheeks 08/26/23 1939    Rondel Baton, MD 08/30/23 [phone]

## 2023-08-26 NOTE — ED Provider Notes (Signed)
 [place] EMERGENCY DEPARTMENT AT [facility] Provider Note   CSN: [id number] Arrival date & time: 08/26/23  1112     History Chief Complaint  Patient presents with   Abdominal Pain    Michele Chan is a 68 y.o. female with history of hypothyroidism presents to the emergency department today for evaluation of diffuse abdominal pain described as ache with triage.  She reports its "just the pain" to me.  Patient reports that she has been having some crampy diffuse pain for the past week.  Reports that it occasionally radiate up to her right chest.  Does not radiate to her back.  She denies any nausea or vomiting.  She reports that she has been feeling more constipated.  This is usually not typical for her.  Her last bowel movement was yesterday but was small and hard.  No hematochezia or melena.  She has not been passing gas or reports that that is typical for her.  She reports that occasionally she will feel short of breath with pain but is not exertional.  She reports the pain is worsening with some movements.  She was concerned today as she woke up with a temperature of 99.7 and went to urgent care who sent her over here because of the abdominal pain.  She is not having any dysuria, hematuria, urgency, frequency.  Denies any vaginal discharge or vaginal bleeding.  She has not had any recent cough or cold symptoms.  No medication taken prior to arrival.  She reports that she has had a colonoscopy within the past 5 to 6 years.  Was told to return in 10 years.  Occasional alcohol use.  Denies any tobacco or illicit drug use.   Abdominal Pain Associated symptoms: constipation   Associated symptoms: no chills, no cough, no dysuria, no fever, no hematuria, no nausea, no shortness of breath, no vaginal bleeding, no vaginal discharge and no vomiting        Home Medications Prior to Admission medications   Medication Sig Start Date End Date Taking? Authorizing Provider  albuterol  (VENTOLIN HFA) 108 (90 Base) MCG/ACT inhaler Inhale 2 puffs into the lungs every 6 (six) hours as needed for wheezing or shortness of breath. 02/16/21   Sheliah Hatch, MD  cetirizine (ZYRTEC) 10 MG tablet Take 10 mg by mouth daily.    [provider]  cholecalciferol (VITAMIN D3) 25 MCG (1000 UNIT) tablet Take 1,000 Units by mouth daily.    [provider]  fluticasone furoate-vilanterol (BREO ELLIPTA) 100-25 MCG/ACT AEPB Inhale 1 puff into the lungs daily. 10/31/22   Sheliah Hatch, MD  levothyroxine (SYNTHROID) 100 MCG tablet TAKE 1 TABLET(100 MCG) BY MOUTH DAILY 08/19/23   Sheliah Hatch, MD  meloxicam (MOBIC) 15 MG tablet Take 15 mg by mouth daily.    [provider]  omeprazole (PRILOSEC) 10 MG capsule Take 10 mg by mouth daily.    [provider]  rosuvastatin (CRESTOR) 10 MG tablet Take 1 tablet (10 mg total) by mouth daily. 05/15/23   Sheliah Hatch, MD      Allergies    Patient has no known allergies.    Review of Systems   Review of Systems  Constitutional:  Negative for chills and fever.  Respiratory:  Negative for cough and shortness of breath.   Gastrointestinal:  Positive for abdominal pain and constipation. Negative for blood in stool, nausea and vomiting.  Genitourinary:  Negative for dysuria, frequency, hematuria, urgency, vaginal  bleeding and vaginal discharge.  Musculoskeletal:  Negative for back pain.    Physical Exam Updated Vital Signs BP (!) 146/79   Pulse 72   Temp 98.1 F (36.7 C) (Oral)   Resp 20   Ht 5\' 8"  (1.727 m)   Wt 104.3 kg   SpO2 96%   BMI 34.97 kg/m  Physical Exam Vitals and nursing note reviewed.  Constitutional:      General: She is not in acute distress.    Appearance: She is not ill-appearing or toxic-appearing.  HENT:     Mouth/Throat:     Comments: dry Eyes:     General: No scleral icterus. Cardiovascular:     Rate and Rhythm: Normal rate.     Pulses:          Radial pulses  are 2+ on the right side and 2+ on the left side.       Dorsalis pedis pulses are 2+ on the right side and 2+ on the left side.       Posterior tibial pulses are 2+ on the right side and 2+ on the left side.  Pulmonary:     Effort: Pulmonary effort is normal. No respiratory distress.  Abdominal:     General: Bowel sounds are normal.     Palpations: Abdomen is soft.     Tenderness: There is generalized abdominal tenderness. There is no guarding or rebound.     Comments: Normal active bowel sounds.  Diffuse abdominal tenderness without guarding or rebound.  Soft.  Skin:    General: Skin is warm and dry.  Neurological:     Mental Status: She is alert.     ED Results / Procedures / Treatments   Labs (all labs ordered are listed, but only abnormal results are displayed) Labs Reviewed  COMPREHENSIVE METABOLIC PANEL WITH GFR - Abnormal; Notable for the following components:      Result Value   Sodium 134 (*)    Glucose, Bld 111 (*)    Total Protein 8.8 (*)    All other components within normal limits  CBC - Abnormal; Notable for the following components:   WBC 12.4 (*)    All other components within normal limits  LIPASE, BLOOD  LACTIC ACID, PLASMA  URINALYSIS, ROUTINE W REFLEX MICROSCOPIC  TROPONIN I (HIGH SENSITIVITY)    EKG EKG Interpretation Date/Time:  Sunday August 26 2023 11:26:35 EDT Ventricular Rate:  75 PR Interval:  180 QRS Duration:  89 QT Interval:  371 QTC Calculation: 415 R Axis:   62  Text Interpretation: Sinus rhythm no acute ST/T changes similar to earlier in the day Confirmed by Pricilla Loveless [id number]) on 08/26/2023 11:41:53 AM  Radiology CT ABDOMEN PELVIS W CONTRAST Result Date: 08/26/2023 CLINICAL DATA:  Abdominal pain. EXAM: CT ABDOMEN AND PELVIS WITH CONTRAST TECHNIQUE: Multidetector CT imaging of the abdomen and pelvis was performed using the standard protocol following bolus administration of intravenous contrast. RADIATION DOSE REDUCTION: This exam was  performed according to the departmental dose-optimization program which includes automated exposure control, adjustment of the mA and/or kV according to patient size and/or use of iterative reconstruction technique. CONTRAST:  OMNIPAQUE IOHEXOL 300 MG/ML  SOLN COMPARISON:  None Available. FINDINGS: Lower chest: The visualized lung bases are clear. No intra-abdominal free air or free fluid. Hepatobiliary: The liver is unremarkable. The retention. Gallstone. No pericholecystic fluid or evidence of acute cholecystitis by CT. Pancreas: There is inflammatory changes of the uncinate processes pancreas consistent with acute pancreatitis.  Correlation with pancreatic enzymes recommended. No drainable fluid collection/abscess or pseudocyst. No dilatation of the main pancreatic duct. Spleen: Normal in size without focal abnormality. Adrenals/Urinary Tract: The adrenal glands unremarkable. The kidneys, visualized ureters, and urinary bladder unremarkable. Stomach/Bowel: There is sigmoid diverticulosis. There is no bowel obstruction or active inflammation. There is a small hiatal hernia. The appendix is normal. Vascular/Lymphatic: Mild aortoiliac atherosclerotic disease. The IVC is unremarkable. No portal venous gas. There is no adenopathy. Reproductive: The uterus is anteverted. Small uterine fibroid. No suspicious adnexal masses. Other: None Musculoskeletal: No acute or significant osseous findings. IMPRESSION: 1. Acute pancreatitis. No drainable fluid collection/abscess or pseudocyst. 2. Cholelithiasis. 3. Sigmoid diverticulosis. No bowel obstruction. Normal appendix. Electronically Signed   By: Elgie Collard M.D.   On: 08/26/2023 15:06    Procedures Procedures   Medications Ordered in ED Medications - No data to display  ED Course/ Medical Decision Making/ A&P                               Medical Decision Making Amount and/or Complexity of Data Reviewed Labs: ordered. Radiology:  ordered.  Risk Prescription drug management.   68 y.o. female presents to the ER for evaluation of abdominal pain with constipation. Differential diagnosis includes but is not limited to AAA, mesenteric ischemia, appendicitis, diverticulitis, DKA, gastroenteritis, nephrolithiasis, pancreatitis, constipation, UTI, bowel obstruction, biliary disease, IBD, PUD, hepatitis. Vital signs mildly elevated blood pressure otherwise unremarkable. Physical exam as noted above.   On physical evaluation, patient has diffuse abdominal tenderness to palpation.  Possibly may be more right lower than other places.  Urgent care's note shows right upper.  No guarding or rebound.  She is not having any nausea or vomiting.  Given the chronicity of the pain as well as the location, will order CT imaging and labs.  I have also ordered the patient fluids as she does look mildly dehydrated.  EKG reviewed and interpreted by my attending and read as Sinus rhythm no acute ST/T changes similar to earlier in the day.   I independently reviewed and interpreted the patient's labs.  CMP shows sodium 134, glucose at 111 with a total protein of 8.8.  No other electrolyte or LFT dramality.  Troponin less than 2.  Lipase at 34, lactic acid within normal limits.  CBC does show mild cytosis at 12.4.  Urinalysis still needs to be collected.  CT Abd : 1. Acute pancreatitis. No drainable fluid collection/abscess or pseudocyst. 2. Cholelithiasis. 3. Sigmoid diverticulosis. No bowel obstruction. Normal appendix. Per radiologist's interpretation.    Patient consumes alcohol once weekly.  She has never had a history of pancreatitis before.  Her lipase is within normal limits as well as her LFTs and total bili.  She does have a large gallstone is present in the gallbladder.  I am going to order a right upper quadrant ultrasound to further look at the gallbladder.  I do think GI consultation is needed after results even if benign.  4:01 PM Care of  Michele Chan transferred to PA Langston Masker at the end of my shift as the patient will require reassessment once labs/imaging have resulted. Patient presentation, ED course, and plan of care discussed with review of all pertinent labs and imaging. Please see his/her note for further details regarding further ED course and disposition. Plan at time of handoff is follow up with [place], GI consultation needed. Likely admission. This may be altered or  completely changed at the discretion of the oncoming team pending results of further workup.  Portions of this report may have been transcribed using voice recognition software. Every effort was made to ensure accuracy; however, inadvertent computerized transcription errors may be present.    Final Clinical Impression(s) / ED Diagnoses Final diagnoses:  None    Rx / DC Orders ED Discharge Orders     None         Achille Rich, PA-C 08/26/23 1603    Pricilla Loveless, MD 08/28/23 [phone]

## 2023-08-26 NOTE — ED Triage Notes (Signed)
 Pt sent by UC and presents with upper abd pain, elevated temp (99.7) and constipation with symptoms starting on 4/3. Yesterday she had some decreased appetite. The abd pain is described as constant and aching with occasional radiation to the R upper chest. Pt has tried APAP without relief. Nothing seems to exacerbate the pain. Denies N/V/D, ShOB.

## 2023-08-26 NOTE — H&P (Signed)
 History and Physical    Michele Chan ZOX:[id number] DOB: September 02, 1955 DOA: 08/26/2023  Patient coming from: Home.  Chief Complaint: Abdominal pain.  HPI: Michele Chan is a 68 y.o. female with history of hypothyroidism, asthma/bronchitis, GERD, hyperlipidemia presents to the ER with complaints of abdominal pain.  Pain is mostly in the lower quadrants with some nausea no vomiting.  Denies any diarrhea.  Pain has been persistent for the last 3 days.  Due to worsening pain patient presents to the ER.  Patient states she one alcoholic drink every week and last one was martini about 5 days ago.  ED Course: In the ER lipase LFTs unremarkable.  WBC count is 12.4.  Troponins were negative EKG shows normal sinus rhythm.  CT abdomen pelvis shows features concerning for acute pancreatitis with gallstones.  No signs of choledocholithiasis.  Ultrasound the gallbladder shows gallstones with no signs of cholecystitis.  Patient started on fluids and pain relief medication admitted for further observation.  Review of Systems: As per HPI, rest all negative.   Past Medical History:  Diagnosis Date   Allergy    Chronic bronchitis (HCC)    GERD (gastroesophageal reflux disease)    History of shingles    History of UTI    Thyroid disease     Past Surgical History:  Procedure Laterality Date   TONSILLECTOMY AND ADENOIDECTOMY       reports that she has never smoked. She has never used smokeless tobacco. She reports current alcohol use of about 3.0 standard drinks of alcohol per week. She reports that she does not use drugs.  No Known Allergies  Family History  Problem Relation Age of Onset   Cancer Mother    Hypertension Mother    Asthma Mother    Miscarriages / [place] Mother    Breast cancer Mother 34   Parkinson's disease Mother    Emphysema Father    Early death Father    Hyperlipidemia Brother    Cancer Maternal Grandmother        ? type   Alzheimer's disease Paternal Grandmother     Cancer Paternal Grandfather        ? type   Heart disease Paternal Grandfather    Hyperlipidemia Paternal Grandfather    Diabetes Paternal Grandfather    Colon cancer Neg Hx    Esophageal cancer Neg Hx    Stomach cancer Neg Hx    Rectal cancer Neg Hx     Prior to Admission medications   Medication Sig Start Date End Date Taking? Authorizing Provider  acetaminophen (TYLENOL) 650 MG CR tablet Take 1,300 mg by mouth every 8 (eight) hours as needed for pain.   Yes [provider]  albuterol (VENTOLIN HFA) 108 (90 Base) MCG/ACT inhaler Inhale 2 puffs into the lungs every 6 (six) hours as needed for wheezing or shortness of breath. 02/16/21  Yes Sheliah Hatch, MD  cetirizine (ZYRTEC) 10 MG tablet Take 10 mg by mouth in the morning.   Yes [provider]  cholecalciferol (VITAMIN D3) 25 MCG (1000 UNIT) tablet Take 1,000 Units by mouth in the morning.   Yes [provider]  fluticasone furoate-vilanterol (BREO ELLIPTA) 100-25 MCG/ACT AEPB Inhale 1 puff into the lungs daily. 10/31/22  Yes Sheliah Hatch, MD  levothyroxine (SYNTHROID) 100 MCG tablet TAKE 1 TABLET(100 MCG) BY MOUTH DAILY Patient taking differently: Take 100 mcg by mouth daily before breakfast. 08/19/23  Yes Sheliah Hatch, MD  meloxicam (MOBIC) 15 MG tablet  Take 15 mg by mouth daily.   Yes [provider]  omeprazole (PRILOSEC) 10 MG capsule Take 10 mg by mouth in the morning.   Yes [provider]  rosuvastatin (CRESTOR) 10 MG tablet Take 1 tablet (10 mg total) by mouth daily. Patient taking differently: Take 10 mg by mouth at bedtime. 05/15/23  Yes Sheliah Hatch, MD    Physical Exam: Constitutional: Moderately built and nourished. Vitals:   08/26/23 1430 08/26/23 1809 08/26/23 2000 08/26/23 2100  BP: 138/82 (!) 144/81 (!) 148/76 (!) 141/78  Pulse: 66 74 70 73  Resp:  17    Temp:  98.2 F (36.8 C)    TempSrc:  Oral    SpO2: 97% 97% 95% 96%  Weight:       Height:       Eyes: Anicteric no pallor. ENMT: No discharge from the ears eyes nose or mouth. Neck: No mass felt.  No neck rigidity. Respiratory: No rhonchi or crepitations. Cardiovascular: S1-S2 heard. Abdomen: Mild epigastric tenderness no guarding or rigidity. Musculoskeletal: No edema. Skin: No rash. Neurologic: Alert awake oriented to time place and person.  Moves all extremities. Psychiatric: Appears normal.  Normal affect.   Labs on Admission: I have personally reviewed following labs and imaging studies  CBC: Recent Labs  Lab 08/26/23 1145  WBC 12.4*  HGB 14.5  HCT 44.0  MCV 96.7  PLT 288   Basic Metabolic Panel: Recent Labs  Lab 08/26/23 1145  NA 134*  K 4.1  CL 98  CO2 25  GLUCOSE 111*  BUN 16  CREATININE 0.76  CALCIUM 9.5   GFR: Estimated Creatinine Clearance: 86.3 mL/min (by C-G formula based on SCr of 0.76 mg/dL). Liver Function Tests: Recent Labs  Lab 08/26/23 1145  AST 24  ALT 21  ALKPHOS 74  BILITOT 1.2  PROT 8.8*  ALBUMIN 4.2   Recent Labs  Lab 08/26/23 1145  LIPASE 34   No results for input(s): "AMMONIA" in the last 168 hours. Coagulation Profile: No results for input(s): "INR", "PROTIME" in the last 168 hours. Cardiac Enzymes: No results for input(s): "CKTOTAL", "CKMB", "CKMBINDEX", "TROPONINI" in the last 168 hours. BNP (last 3 results) No results for input(s): "PROBNP" in the last 8760 hours. HbA1C: No results for input(s): "HGBA1C" in the last 72 hours. CBG: No results for input(s): "GLUCAP" in the last 168 hours. Lipid Profile: No results for input(s): "CHOL", "HDL", "LDLCALC", "TRIG", "CHOLHDL", "LDLDIRECT" in the last 72 hours. Thyroid Function Tests: No results for input(s): "TSH", "T4TOTAL", "FREET4", "T3FREE", "THYROIDAB" in the last 72 hours. Anemia Panel: No results for input(s): "VITAMINB12", "FOLATE", "FERRITIN", "TIBC", "IRON", "RETICCTPCT" in the last 72 hours. Urine analysis:    Component Value Date/Time    COLORURINE YELLOW 08/26/2023 1626   APPEARANCEUR CLEAR 08/26/2023 1626   LABSPEC 1.028 08/26/2023 1626   PHURINE 5.0 08/26/2023 1626   GLUCOSEU NEGATIVE 08/26/2023 1626   HGBUR SMALL (A) 08/26/2023 1626   BILIRUBINUR NEGATIVE 08/26/2023 1626   KETONESUR 20 (A) 08/26/2023 1626   PROTEINUR NEGATIVE 08/26/2023 1626   NITRITE NEGATIVE 08/26/2023 1626   LEUKOCYTESUR NEGATIVE 08/26/2023 1626   Sepsis Labs: @LABRCNTIP (procalcitonin:4,lacticidven:4) )No results found for this or any previous visit (from the past 240 hours).   Radiological Exams on Admission: US Abdomen Limited RUQ (LIVER/GB) Result Date: 08/26/2023 CLINICAL DATA:  Right upper quadrant pain for 4 days. EXAM: ULTRASOUND ABDOMEN LIMITED RIGHT UPPER QUADRANT COMPARISON:  08/26/2023. FINDINGS: Gallbladder: Stones are present within the gallbladder. There is borderline  gallbladder wall thickening at 4 mm. No sonographic Murphy sign noted by sonographer. Common bile duct: Diameter: 5.5 mm Liver: Hypodensity is noted at the gallbladder fossa, possible focal fatty sparing. Increased parenchymal echogenicity. Portal vein is patent on color Doppler imaging with normal direction of blood flow towards the liver. Other: None. IMPRESSION: 1. Cholelithiasis without acute cholecystitis. 2. Hepatic steatosis with focal fatty sparing at the gallbladder fossa. Electronically Signed   By: Thornell Sartorius M.D.   On: 08/26/2023 18:43   CT ABDOMEN PELVIS W CONTRAST Result Date: 08/26/2023 CLINICAL DATA:  Abdominal pain. EXAM: CT ABDOMEN AND PELVIS WITH CONTRAST TECHNIQUE: Multidetector CT imaging of the abdomen and pelvis was performed using the standard protocol following bolus administration of intravenous contrast. RADIATION DOSE REDUCTION: This exam was performed according to the departmental dose-optimization program which includes automated exposure control, adjustment of the mA and/or kV according to patient size and/or use of iterative reconstruction  technique. CONTRAST:  OMNIPAQUE IOHEXOL 300 MG/ML  SOLN COMPARISON:  None Available. FINDINGS: Lower chest: The visualized lung bases are clear. No intra-abdominal free air or free fluid. Hepatobiliary: The liver is unremarkable. The retention. Gallstone. No pericholecystic fluid or evidence of acute cholecystitis by CT. Pancreas: There is inflammatory changes of the uncinate processes pancreas consistent with acute pancreatitis. Correlation with pancreatic enzymes recommended. No drainable fluid collection/abscess or pseudocyst. No dilatation of the main pancreatic duct. Spleen: Normal in size without focal abnormality. Adrenals/Urinary Tract: The adrenal glands unremarkable. The kidneys, visualized ureters, and urinary bladder unremarkable. Stomach/Bowel: There is sigmoid diverticulosis. There is no bowel obstruction or active inflammation. There is a small hiatal hernia. The appendix is normal. Vascular/Lymphatic: Mild aortoiliac atherosclerotic disease. The IVC is unremarkable. No portal venous gas. There is no adenopathy. Reproductive: The uterus is anteverted. Small uterine fibroid. No suspicious adnexal masses. Other: None Musculoskeletal: No acute or significant osseous findings. IMPRESSION: 1. Acute pancreatitis. No drainable fluid collection/abscess or pseudocyst. 2. Cholelithiasis. 3. Sigmoid diverticulosis. No bowel obstruction. Normal appendix. Electronically Signed   By: Elgie Collard M.D.   On: 08/26/2023 15:06    EKG: Independently reviewed.  Normal sinus rhythm.  Assessment/Plan Principal Problem:   Acute pancreatitis Active Problems:   Hypothyroid   Hyperlipidemia   Gallstones    Acute pancreatitis likely related to gallstones.  Patient states she only drinks about one drink of alcoholic drink once a week.  Check triglycerides.  If repeat LFTs are showing elevation will get MRCP.  Will consult [facility].  Keep patient n.p.o. IV fluids for now.  Pain relief medications.   Patient eventually will need cholecystectomy. Hypothyroidism takes Synthroid. Hyperlipidemia on statins. History of asthma/bronchitis takes as needed inhalers. History of GERD on PPI.  Since patient has acute pancreatitis will need further management and more than 2 midnight stay.   DVT prophylaxis: SCDs. Code Status: Full code. Family Communication: Discussed with patient. Disposition Plan: Medical floor. Consults called:  [facility]. Admission status: Observation.

## 2023-08-26 NOTE — ED Triage Notes (Addendum)
 Pt c/o low grade fever, abdominal pain, and "erratic heart rate with activity" and not feeling well for 3 days.   She took tylenol before coming in  She had small BM yesterday.

## 2023-08-26 NOTE — ED Provider Notes (Signed)
 [facility]    CSN: [id number] Arrival date & time: 08/26/23  1016      History   Chief Complaint Chief Complaint  Patient presents with   Abdominal Pain    Fever of 99.9Heart rate erratic - Entered by patient    HPI Mic[facility] Chan is a 68 y.o. female.   Patient here today for evaluation of generalized abdominal pain, right sided chest pain, and low grade fever that started about 3 days ago and seems to be worsening. She reports constipation with only minimal BM yesterday. She reports she has felt more fatigued but denies any shortness of breath or lightheadedness.  The history is provided by the patient.  Abdominal Pain Associated symptoms: chest pain, constipation, fatigue and fever   Associated symptoms: no diarrhea, no nausea, no shortness of breath and no vomiting     Past Medical History:  Diagnosis Date   Allergy    Chronic bronchitis (HCC)    GERD (gastroesophageal reflux disease)    History of shingles    History of UTI    Thyroid disease     Patient Active Problem List   Diagnosis Date Noted   Hyperlipidemia 09/22/2020   Vitamin D deficiency 03/24/2020   Hypothyroid 09/04/2018   Physical exam 06/19/2017   Obesity (BMI 30-39.9) 06/19/2017   Chronic cough 03/09/2017    Past Surgical History:  Procedure Laterality Date   TONSILLECTOMY AND ADENOIDECTOMY      OB History     Gravida  1   Para      Term      Preterm      AB  1   Living         SAB      IAB  1   Ectopic      Multiple      Live Births               Home Medications    Prior to Admission medications   Medication Sig Start Date End Date Taking? Authorizing Provider  albuterol (VENTOLIN HFA) 108 (90 Base) MCG/ACT inhaler Inhale 2 puffs into the lungs every 6 (six) hours as needed for wheezing or shortness of breath. 02/16/21   Sheliah Hatch, MD  cetirizine (ZYRTEC) 10 MG tablet Take 10 mg by mouth daily.    [provider]   cholecalciferol (VITAMIN D3) 25 MCG (1000 UNIT) tablet Take 1,000 Units by mouth daily.    [provider]  fluticasone furoate-vilanterol (BREO ELLIPTA) 100-25 MCG/ACT AEPB Inhale 1 puff into the lungs daily. 10/31/22   Sheliah Hatch, MD  levothyroxine (SYNTHROID) 100 MCG tablet TAKE 1 TABLET(100 MCG) BY MOUTH DAILY 08/19/23   Sheliah Hatch, MD  meloxicam (MOBIC) 15 MG tablet Take 15 mg by mouth daily.    [provider]  omeprazole (PRILOSEC) 10 MG capsule Take 10 mg by mouth daily.    [provider]  rosuvastatin (CRESTOR) 10 MG tablet Take 1 tablet (10 mg total) by mouth daily. 05/15/23   Sheliah Hatch, MD    Family History Family History  Problem Relation Age of Onset   Cancer Mother    Hypertension Mother    Asthma Mother    Miscarriages / [place] Mother    Breast cancer Mother 67   Parkinson's disease Mother    Emphysema Father    Early death Father    Hyperlipidemia Brother    Cancer Maternal Grandmother        ?  type   Alzheimer's disease Paternal Grandmother    Cancer Paternal Grandfather        ? type   Heart disease Paternal Grandfather    Hyperlipidemia Paternal Grandfather    Diabetes Paternal Grandfather    Colon cancer Neg Hx    Esophageal cancer Neg Hx    Stomach cancer Neg Hx    Rectal cancer Neg Hx     Social History Social History   Tobacco Use   Smoking status: Never   Smokeless tobacco: Never  Vaping Use   Vaping status: Never Used  Substance Use Topics   Alcohol use: Yes    Alcohol/week: 3.0 standard drinks of alcohol    Types: 3 Standard drinks or equivalent per week   Drug use: No     Allergies   Patient has no known allergies.   Review of Systems Review of Systems  Constitutional:  Positive for fatigue and fever.  Eyes:  Negative for discharge and redness.  Respiratory:  Negative for shortness of breath.   Cardiovascular:  Positive for chest pain.  Gastrointestinal:  Positive for  abdominal pain and constipation. Negative for blood in stool, diarrhea, nausea and vomiting.     Physical Exam Triage Vital Signs ED Triage Vitals  Encounter Vitals Group     BP      Systolic BP Percentile      Diastolic BP Percentile      Pulse      Resp      Temp      Temp src      SpO2      Weight      Height      Head Circumference      Peak Flow      Pain Score      Pain Loc      Pain Education      Exclude from Growth Chart    No data found.  Updated Vital Signs BP 120/79 (BP Location: Right Arm)   Pulse 79   Temp 98.2 F (36.8 C) (Oral)   Resp 18   Ht 5\' 8"  (1.727 m)   Wt 230 lb (104.3 kg)   SpO2 94%   BMI 34.97 kg/m   Visual Acuity Right Eye Distance:   Left Eye Distance:   Bilateral Distance:    Right Eye Near:   Left Eye Near:    Bilateral Near:     Physical Exam Vitals and nursing note reviewed.  Constitutional:      General: She is not in acute distress.    Appearance: Normal appearance. She is not ill-appearing.  HENT:     Head: Normocephalic and atraumatic.  Eyes:     Conjunctiva/sclera: Conjunctivae normal.  Cardiovascular:     Rate and Rhythm: Normal rate and regular rhythm.  Pulmonary:     Effort: Pulmonary effort is normal. No respiratory distress.     Breath sounds: Normal breath sounds. No wheezing, rhonchi or rales.  Abdominal:     General: Abdomen is flat. Bowel sounds are normal. There is no distension.     Palpations: Abdomen is soft.     Tenderness: There is abdominal tenderness (diffusely to lower abdomen, RUQ). There is no guarding or rebound.  Neurological:     Mental Status: She is alert.  Psychiatric:        Mood and Affect: Mood normal.        Behavior: Behavior normal.        Thought  Content: Thought content normal.      UC Treatments / Results  Labs (all labs ordered are listed, but only abnormal results are displayed) Labs Reviewed - No data to display  EKG   Radiology No results  found.  Procedures Procedures (including critical care time)  Medications Ordered in UC Medications - No data to display  Initial Impression / Assessment and Plan / UC Course  I have reviewed the triage vital signs and the nursing notes.  Pertinent labs & imaging results that were available during my care of the patient were reviewed by me and considered in my medical decision making (see chart for details).     EKG without ST elevation. Advised further evaluation in the ED given RUQ pain and right sided chest pain along with lower abdominal pain- unclear picture of possible gallbladder etiology vs diverticulitis. Patient is agreeable and is stable to transport herself via POV.     Final Clinical Impressions(s) / UC Diagnoses   Final diagnoses:  RUQ pain  Constipation, unspecified constipation type   Discharge Instructions   None    ED Prescriptions   None    PDMP not reviewed this encounter.   Tomi Bamberger, PA-C 08/26/23 1053

## 2023-08-27 DIAGNOSIS — K8500 Idiopathic acute pancreatitis without necrosis or infection: Secondary | ICD-10-CM

## 2023-08-27 DIAGNOSIS — K859 Acute pancreatitis without necrosis or infection, unspecified: Secondary | ICD-10-CM | POA: Diagnosis not present

## 2023-08-27 DIAGNOSIS — K219 Gastro-esophageal reflux disease without esophagitis: Secondary | ICD-10-CM | POA: Insufficient documentation

## 2023-08-27 LAB — CBC WITH DIFFERENTIAL/PLATELET
Abs Immature Granulocytes: 0.04 10*3/uL (ref 0.00–0.07)
Basophils Absolute: 0 10*3/uL (ref 0.0–0.1)
Basophils Relative: 0 %
Eosinophils Absolute: 0.2 10*3/uL (ref 0.0–0.5)
Eosinophils Relative: 2 %
HCT: 38.8 % (ref 36.0–46.0)
Hemoglobin: 12.4 g/dL (ref 12.0–15.0)
Immature Granulocytes: 0 %
Lymphocytes Relative: 18 %
Lymphs Abs: 1.8 10*3/uL (ref 0.7–4.0)
MCH: 31.7 pg (ref 26.0–34.0)
MCHC: 32 g/dL (ref 30.0–36.0)
MCV: 99.2 fL (ref 80.0–100.0)
Monocytes Absolute: 0.9 10*3/uL (ref 0.1–1.0)
Monocytes Relative: 9 %
Neutro Abs: 7.2 10*3/uL (ref 1.7–7.7)
Neutrophils Relative %: 71 %
Platelets: 249 10*3/uL (ref 150–400)
RBC: 3.91 MIL/uL (ref 3.87–5.11)
RDW: 13.4 % (ref 11.5–15.5)
WBC: 10.1 10*3/uL (ref 4.0–10.5)
nRBC: 0 % (ref 0.0–0.2)

## 2023-08-27 LAB — TRIGLYCERIDES: Triglycerides: 237 mg/dL — ABNORMAL HIGH (ref <150)

## 2023-08-27 LAB — BASIC METABOLIC PANEL WITH GFR
Anion gap: 12 (ref 5–15)
BUN: 15 mg/dL (ref 8–23)
CO2: 25 mmol/L (ref 22–32)
Calcium: 9.1 mg/dL (ref 8.9–10.3)
Chloride: 100 mmol/L (ref 98–111)
Creatinine, Ser: 0.56 mg/dL (ref 0.44–1.00)
GFR, Estimated: >60 mL/min (ref >60)
Glucose, Bld: 78 mg/dL (ref 70–99)
Potassium: 3.9 mmol/L (ref 3.5–5.1)
Sodium: 137 mmol/L (ref 135–145)

## 2023-08-27 LAB — HIV ANTIBODY (ROUTINE TESTING W REFLEX): HIV Screen 4th Generation wRfx: NONREACTIVE

## 2023-08-27 LAB — HEPATIC FUNCTION PANEL
ALT: 14 U/L (ref 0–44)
AST: 11 U/L — ABNORMAL LOW (ref 15–41)
Albumin: 3.3 g/dL — ABNORMAL LOW (ref 3.5–5.0)
Alkaline Phosphatase: 60 U/L (ref 38–126)
Bilirubin, Direct: 0.1 mg/dL (ref 0.0–0.2)
Indirect Bilirubin: 0.6 mg/dL (ref 0.3–0.9)
Total Bilirubin: 0.7 mg/dL (ref 0.0–1.2)
Total Protein: 7 g/dL (ref 6.5–8.1)

## 2023-08-27 MED ORDER — ACETAMINOPHEN 325 MG PO TABS
650.0000 mg | ORAL_TABLET | Freq: Four times a day (QID) | ORAL | Status: DC | PRN
  Administered 2023-08-27: 650 mg via ORAL
  Filled 2023-08-27: qty 2

## 2023-08-27 MED ORDER — FLUTICASONE FUROATE-VILANTEROL 100-25 MCG/ACT IN AEPB
1.0000 | INHALATION_SPRAY | Freq: Every day | RESPIRATORY_TRACT | Status: DC
  Administered 2023-08-27 – 2023-08-28 (×2): 1 via RESPIRATORY_TRACT
  Filled 2023-08-27: qty 28

## 2023-08-27 MED ORDER — HYDROMORPHONE HCL 1 MG/ML IJ SOLN: 0.2000 mg | INTRAMUSCULAR | Status: DC | PRN

## 2023-08-27 MED ORDER — OXYCODONE HCL 5 MG PO TABS
5.0000 mg | ORAL_TABLET | ORAL | Status: DC | PRN
  Administered 2023-08-27: 5 mg via ORAL
  Filled 2023-08-27: qty 1

## 2023-08-27 MED ORDER — BISACODYL 10 MG RE SUPP: 10.0000 mg | Freq: Once | RECTAL | Status: DC

## 2023-08-27 MED ORDER — LEVOTHYROXINE SODIUM 100 MCG PO TABS
100.0000 ug | ORAL_TABLET | Freq: Every day | ORAL | Status: DC
  Administered 2023-08-28: 100 ug via ORAL
  Filled 2023-08-27: qty 1

## 2023-08-27 NOTE — Care Management Obs Status (Signed)
 MEDICARE OBSERVATION STATUS NOTIFICATION   Patient Details  Name: [facility] MRN: [id number] Date of Birth: 1955/12/25   Medicare Observation Status Notification Given:  Yes    Diona Browner, LCSW 08/27/2023, 3:53 PM

## 2023-08-27 NOTE — Consult Note (Addendum)
 Referring Provider: Cheron Schaumann, PA-C Primary Care Physician:  Sheliah Hatch, MD Primary Gastroenterologist:  Dr. Lavon Paganini  Reason for Consultation:  Pancreatitis  HPI: Michele Chan is a 68 y.o. female with limited PMH.  Known to Dr. Lavon Paganini for screening colonoscopy only.  Admitted for pancreatitis.  GI consulted.  She tells me that she has never had pancreatitis in the past.  Says that she did have one martini last Wednesday and then Thursday she started feeling bad with abdominal pain.  Saturday symptoms got much worse.  Abdominal pain progressed and she just felt bad.  Says that typically she only drinks ETOH once a week when out with friends (minimal).  Triglycerides 237.  Hepatic function panel normal.  CBC and BMP normal.  Lipase was normal at 34.  CT scan abdomen and pelvis with contrast:  Acute pancreatitis with gallstones.  Right upper quadrant ultrasound showed cholelithiasis without acute cholecystitis and hepatic steatosis with focal fat sparing at the gallbladder fossa.  Common bile duct 5.5 mm/no dilatation.  Looks good.  Passing flatus.  Tolerated some clear liquids from the floor and it looks like they have advanced her diet to full liquids.    No new medications.  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   Past Medical History:  Diagnosis Date   Allergy    Chronic bronchitis (HCC)    GERD (gastroesophageal reflux disease)    History of shingles    History of UTI    Thyroid disease     Past Surgical History:  Procedure Laterality Date   TONSILLECTOMY AND ADENOIDECTOMY      Prior to Admission medications   Medication Sig Start Date End Date Taking? Authorizing Provider  acetaminophen (TYLENOL) 650 MG CR tablet Take 1,300 mg by mouth every 8 (eight) hours as needed for pain.   Yes [provider]  albuterol (VENTOLIN HFA) 108 (90 Base) MCG/ACT inhaler Inhale 2 puffs into the lungs every 6 (six) hours as needed for wheezing or  shortness of breath. 02/16/21  Yes Sheliah Hatch, MD  cetirizine (ZYRTEC) 10 MG tablet Take 10 mg by mouth in the morning.   Yes [provider]  cholecalciferol (VITAMIN D3) 25 MCG (1000 UNIT) tablet Take 1,000 Units by mouth in the morning.   Yes [provider]  fluticasone furoate-vilanterol (BREO ELLIPTA) 100-25 MCG/ACT AEPB Inhale 1 puff into the lungs daily. 10/31/22  Yes Sheliah Hatch, MD  levothyroxine (SYNTHROID) 100 MCG tablet TAKE 1 TABLET(100 MCG) BY MOUTH DAILY Patient taking differently: Take 100 mcg by mouth daily before breakfast. 08/19/23  Yes Sheliah Hatch, MD  meloxicam (MOBIC) 15 MG tablet Take 15 mg by mouth daily.   Yes [provider]  omeprazole (PRILOSEC) 10 MG capsule Take 10 mg by mouth in the morning.   Yes [provider]  rosuvastatin (CRESTOR) 10 MG tablet Take 1 tablet (10 mg total) by mouth daily. Patient taking differently: Take 10 mg by mouth at bedtime. 05/15/23  Yes Sheliah Hatch, MD    Current Facility-Administered Medications  Medication Dose Route Frequency Provider Last Rate Last Admin   fluticasone furoate-vilanterol (BREO ELLIPTA) 100-25 MCG/ACT 1 puff  1 puff Inhalation Daily [place], Alvira Philips, DO       HYDROmorphone (DILAUDID) injection 0.2 mg  0.2 mg Intravenous Q3H PRN Eduard Clos, MD   0.2 mg at 08/26/23 2213   lactated ringers infusion   Intravenous Continuous Eduard Clos, MD 125 mL/hr  at 08/27/23 0445 New Bag at 08/27/23 0445   [START ON 08/28/2023] levothyroxine (SYNTHROID) tablet 100 mcg  100 mcg Oral Q0600 [place], Eric J, DO        Allergies as of 08/26/2023   (No Known Allergies)    Family History  Problem Relation Age of Onset   Cancer Mother    Hypertension Mother    Asthma Mother    Miscarriages / [place] Mother    Breast cancer Mother 55   Parkinson's disease Mother    Emphysema Father    Early death Father    Hyperlipidemia Brother    Cancer  Maternal Grandmother        ? type   Alzheimer's disease Paternal Grandmother    Cancer Paternal Grandfather        ? type   Heart disease Paternal Grandfather    Hyperlipidemia Paternal Grandfather    Diabetes Paternal Grandfather    Colon cancer Neg Hx    Esophageal cancer Neg Hx    Stomach cancer Neg Hx    Rectal cancer Neg Hx     Social History   Socioeconomic History   Marital status: Widowed    Spouse name: Not on file   Number of children: Not on file   Years of education: Not on file   Highest education level: Not on file  Occupational History   Not on file  Tobacco Use   Smoking status: Never   Smokeless tobacco: Never  Vaping Use   Vaping status: Never Used  Substance and Sexual Activity   Alcohol use: Yes    Alcohol/week: 3.0 standard drinks of alcohol    Types: 3 Standard drinks or equivalent per week   Drug use: No   Sexual activity: Not Currently  Other Topics Concern   Not on file  Social History Narrative   Not on file   Social Drivers of Health   Financial Resource Strain: Not on file  Food Insecurity: No Food Insecurity (08/27/2023)   Hunger Vital Sign    Worried About Running Out of Food in the Last Year: Never true    Ran Out of Food in the Last Year: Never true  Transportation Needs: No Transportation Needs (08/26/2023)   PRAPARE - Administrator, Civil Service (Medical): No    Lack of Transportation (Non-Medical): No  Physical Activity: Not on file  Stress: Not on file  Social Connections: Unknown (08/27/2023)   Social Connection and Isolation Panel [NHANES]    Frequency of Communication with Friends and Family: More than three times a week    Frequency of Social Gatherings with Friends and Family: More than three times a week    Attends Religious Services: 1 to 4 times per year    Active Member of [employer] or Organizations: Yes    Attends Banker Meetings: Not on file    Marital Status: Patient declined  Intimate  Partner Violence: Not At Risk (08/27/2023)   Humiliation, Afraid, Rape, and Kick questionnaire    Fear of Current or Ex-Partner: No    Emotionally Abused: No    Physically Abused: No    Sexually Abused: No    Review of Systems: ROS is O/W negative except as mentioned in HPI.  Physical Exam: Vital signs in last 24 hours: Temp:  [97.9 F (36.6 C)-98.2 F (36.8 C)] 98.1 F (36.7 C) (04/07 0607) Pulse Rate:  [65-79] 69 (04/07 0607) Resp:  [12-20] 17 (04/07 0607) BP: (119-148)/(67-82) 136/78 (  04/07 0607) SpO2:  [93 %-99 %] 94 % (04/07 0607) Weight:  [104.3 kg] 104.3 kg (04/06 1127) Last BM Date : 08/26/23 General:  Alert, Well-developed, well-nourished, pleasant and cooperative in NAD Head:  Normocephalic and atraumatic. Eyes:  Sclera clear, no icterus.  Conjunctiva pink. Ears:  Normal auditory acuity. Mouth:  No deformity or lesions.   Lungs:  Clear throughout to auscultation.  No wheezes, crackles, or rhonchi.  Heart:  Regular rate and rhythm; no murmurs, clicks, rubs, or gallops. Abdomen:  Soft, non-distended.  BS present.  Mild RUQ to epigastric TTP.  Msk:  Symmetrical without gross deformities. Pulses:  Normal pulses noted. Extremities:  Without clubbing or edema. Neurologic:  Alert and oriented x 4;  grossly normal neurologically. Skin:  Intact without significant lesions or rashes. Psych:  Alert and cooperative. Normal mood and affect.  Intake/Output from previous day: 04/06 0701 - 04/07 0700 In: 1000 [IV Piggyback:1000] Out: -   Lab Results: Recent Labs    08/26/23 1145 08/27/23 0551  WBC 12.4* 10.1  HGB 14.5 12.4  HCT 44.0 38.8  PLT 288 249   BMET Recent Labs    08/26/23 1145 08/27/23 0551  NA 134* 137  K 4.1 3.9  CL 98 100  CO2 25 25  GLUCOSE 111* 78  BUN 16 15  CREATININE 0.76 0.56  CALCIUM 9.5 9.1   LFT Recent Labs    08/27/23 0551  PROT 7.0  ALBUMIN 3.3*  AST 11*  ALT 14  ALKPHOS 60  BILITOT 0.7  BILIDIR 0.1  IBILI 0.6    Studies/Results: US Abdomen Limited RUQ (LIVER/GB) Result Date: 08/26/2023 CLINICAL DATA:  Right upper quadrant pain for 4 days. EXAM: ULTRASOUND ABDOMEN LIMITED RIGHT UPPER QUADRANT COMPARISON:  08/26/2023. FINDINGS: Gallbladder: Stones are present within the gallbladder. There is borderline gallbladder wall thickening at 4 mm. No sonographic Murphy sign noted by sonographer. Common bile duct: Diameter: 5.5 mm Liver: Hypodensity is noted at the gallbladder fossa, possible focal fatty sparing. Increased parenchymal echogenicity. Portal vein is patent on color Doppler imaging with normal direction of blood flow towards the liver. Other: None. IMPRESSION: 1. Cholelithiasis without acute cholecystitis. 2. Hepatic steatosis with focal fatty sparing at the gallbladder fossa. Electronically Signed   By: Thornell Sartorius M.D.   On: 08/26/2023 18:43   CT ABDOMEN PELVIS W CONTRAST Result Date: 08/26/2023 CLINICAL DATA:  Abdominal pain. EXAM: CT ABDOMEN AND PELVIS WITH CONTRAST TECHNIQUE: Multidetector CT imaging of the abdomen and pelvis was performed using the standard protocol following bolus administration of intravenous contrast. RADIATION DOSE REDUCTION: This exam was performed according to the departmental dose-optimization program which includes automated exposure control, adjustment of the mA and/or kV according to patient size and/or use of iterative reconstruction technique. CONTRAST:  OMNIPAQUE IOHEXOL 300 MG/ML  SOLN COMPARISON:  None Available. FINDINGS: Lower chest: The visualized lung bases are clear. No intra-abdominal free air or free fluid. Hepatobiliary: The liver is unremarkable. The retention. Gallstone. No pericholecystic fluid or evidence of acute cholecystitis by CT. Pancreas: There is inflammatory changes of the uncinate processes pancreas consistent with acute pancreatitis. Correlation with pancreatic enzymes recommended. No drainable fluid collection/abscess or pseudocyst. No dilatation  of the main pancreatic duct. Spleen: Normal in size without focal abnormality. Adrenals/Urinary Tract: The adrenal glands unremarkable. The kidneys, visualized ureters, and urinary bladder unremarkable. Stomach/Bowel: There is sigmoid diverticulosis. There is no bowel obstruction or active inflammation. There is a small hiatal hernia. The appendix is normal. Vascular/Lymphatic: Mild aortoiliac atherosclerotic  disease. The IVC is unremarkable. No portal venous gas. There is no adenopathy. Reproductive: The uterus is anteverted. Small uterine fibroid. No suspicious adnexal masses. Other: None Musculoskeletal: No acute or significant osseous findings. IMPRESSION: 1. Acute pancreatitis. No drainable fluid collection/abscess or pseudocyst. 2. Cholelithiasis. 3. Sigmoid diverticulosis. No bowel obstruction. Normal appendix. Electronically Signed   By: Elgie Collard M.D.   On: 08/26/2023 15:06    IMPRESSION:  *Acute pancreatitis: Seen by CT scan, but lipase normal.  LFTs normal.  Does have gallstones, but no biliary dilatation or sign of CBD stone.  Triglycerides slightly elevated.  Is on omeprazole and meloxicam daily.  Had one martini on 4/2, minimal ETOH use, usually once a week with friends.  PLAN: - Conservative management/supportive care with IV fluids, antiemetics, pain control. -? Consider discontinuing the meloxicam. -Has been advanced to full liquid diet so will see how she does. -Plan for MRI abdomen/MRCP down the road once acute inflammation resolves.   Princella Pellegrini. Zehr  08/27/2023, 8:41 AM     [facility] Attending   I have taken an interval history, reviewed the chart and examined the patient. Majority of medical decision making performed by me.I agree with the Advanced Practitioner's note, impression and recommendations with the following additions:  Acute pancreatitis localized to uncinate process - unclear etiology. Alcohol might have triggered though not an excessive user by hx at  least. Pain is a little unusual as it is RUQ and LUQ. Abdomen exam - she is tender but overall benign feel TG level not high enough. Has a large gallstone, NL LFT - doubt that as cause as small stones cause pancreatitis. Reality is it has been several days since onset so labs not helpful. Medication is possible but always hard to say - rosuvastatin is possible culprit. Some NSAID's are possible triggers though do not see meloxicam on lists of causative drigs.  I recommend that she have an MR/MRCP to exclude subtle tumor as a cause - but this needs to occur in 2 months or so when it is likely inflammatory changes have resolved. We can arrange outpatient f/u (she is Nandigam patient) in office and then order the MR.  She has not had BM in 4 days will order Duolcolax suppository  She is interested in going home soon and I think that should be ok. ? Tomorrow Needs pain management plan. Can gradually advance diet. I have advised her to avoid EtOH.  Iva Boop, MD, [facility] See Loretha Stapler on call - gastroenterology for best contact person 08/27/2023 3:59 PM

## 2023-08-27 NOTE — Progress Notes (Addendum)
 PROGRESS NOTE    Michele Chan  ZOX:[id number] DOB: [birth year] DOA: 08/26/2023 PCP: Sheliah Hatch, MD    Brief Narrative:   Michele Chan is a 68 y.o. female with past medical history significant for hypothyroidism, asthma/bronchitis, HLD, GERD, osteoarthritis who presented to [facility] ED on 08/26/2023 with abdominal pain, nausea.  Ongoing for the last 3 days.  Localized to upper quadrants.  Denies any vomiting, no diarrhea.  Currently endorses minimal alcohol intake, 1 drink per week.  Also on meloxicam and Crestor.  No new medications.  In the ED, temperature 98.1 F, HR 77, RR 20, BP 121/81, SpO2 97% on room air.  WBC 12.4, hemoglobin 14.5, platelet count 288.  Sodium 134, potassium 4.1, chloride 98, CO2 25, glucose 111, BUN 16, creatinine 0.76.  AST 34, ALT 24, total bilirubin 1.2.  High sensitive troponin less than 2.  Lipase 34.  Urinalysis unrevealing.  CT abdomen/pelvis with contrast with acute pancreatitis without fluid collection/abscess or pseudocyst, cholelithiasis, sigmoid diverticulosis without bowel obstruction, normal appendix.  Right upper quadrant ultrasound with cholelithiasis without acute cholecystitis, hepatic steatosis with focal fatty sparing at the gallbladder fossa.  GI consulted.  Curies consulted for admission for further evaluation and management of acute pancreatitis.  Assessment & Plan:    Acute pancreatitis Patient presenting to ED with 3-day history of progressive abdominal pain associate with nausea.  Does endorse alcohol use but only 1 drink per week.  CT abdomen/pelvis with contrast with acute pancreatitis without fluid collection or abscess.  Right upper quadrant ultrasound notable for cholelithiasis without acute cholecystitis. -- [place] [facility] following, appreciate assistance -- LR at 125 mL/h -- Advance to full liquid diet -- Oxycodone 5 g p.o. every 4 hours.  Moderate pain -- Dilaudid 0.2mg  IV q3h PRN severe pain -- GI recommends outpatient  MRCP once inflammation improves, 2 months. -- CMP, Lipase in the am  Hypothyroidism -- Levothyroxine 100 mcg p.o. daily  Hyperlipidemia -- Resume statin on discharge  Osteoarthritis On meloxicam 50 mg p.o. daily at home.  Obesity, class II Body mass index is 34.97 kg/m.   DVT prophylaxis: SCDs Start: 08/26/23 2130    Code Status: Full Code Family Communication: No family present at bedside this morning  Disposition Plan:  Level of care: Med-Surg Status is: Observation The patient remains OBS appropriate and will d/c before 2 midnights.    Consultants:  [facility]  Procedures:  None  Antimicrobials:  None   Subjective: Patient seen examined bedside, lying in bed.  Abdominal pain improved, wishes further advancement of diet.  Seen by GI.  No other specific complaints, concerns or questions at this time.  Denies headache, no visual changes, no chest pain, no palpitations, no shortness of breath, no nausea/vomiting/diarrhea, no fever/chills/night sweats, no focal weakness, no fatigue, no paresthesias.  No acute events overnight per nursing staff.  Objective: Vitals:   08/27/23 0607 08/27/23 0932 08/27/23 1023 08/27/23 1139  BP: 136/78  139/75 (!) 143/75  Pulse: 69  66 70  Resp: 17  18 18   Temp: 98.1 F (36.7 C)  98 F (36.7 C) 98.8 F (37.1 C)  TempSrc:      SpO2: 94% 95% 99% 98%  Weight:      Height:        Intake/Output Summary (Last 24 hours) at 08/27/2023 1657 Last data filed at 08/27/2023 1300 Gross per 24 hour  Intake 1360 ml  Output --  Net 1360 ml   Filed Weights   08/26/23 1127  Weight: 104.3 kg    Examination:  Physical Exam: GEN: NAD, alert and oriented x 3, obese HEENT: NCAT, PERRL, EOMI, sclera clear, MMM PULM: CTAB w/o wheezes/crackles, normal respiratory effort, on room air CV: RRR w/o M/G/R GI: abd soft, nondistended, mild epigastric, upper quadrant tenderness, + BS, no R/G/M MSK: no peripheral edema, muscle strength  globally intact 5/5 bilateral upper/lower extremities NEURO: CN II-XII intact, no focal deficits, sensation to light touch intact PSYCH: normal mood/affect Integumentary: dry/intact, no rashes or wounds    Data Reviewed: I have personally reviewed following labs and imaging studies  CBC: Recent Labs  Lab 08/26/23 1145 08/27/23 0551  WBC 12.4* 10.1  NEUTROABS  --  7.2  HGB 14.5 12.4  HCT 44.0 38.8  MCV 96.7 99.2  PLT 288 249   Basic Metabolic Panel: Recent Labs  Lab 08/26/23 1145 08/27/23 0551  NA 134* 137  K 4.1 3.9  CL 98 100  CO2 25 25  GLUCOSE 111* 78  BUN 16 15  CREATININE 0.76 0.56  CALCIUM 9.5 9.1   GFR: Estimated Creatinine Clearance: 86.3 mL/min (by C-G formula based on SCr of 0.56 mg/dL). Liver Function Tests: Recent Labs  Lab 08/26/23 1145 08/27/23 0551  AST 24 11*  ALT 21 14  ALKPHOS 74 60  BILITOT 1.2 0.7  PROT 8.8* 7.0  ALBUMIN 4.2 3.3*   Recent Labs  Lab 08/26/23 1145  LIPASE 34   No results for input(s): "AMMONIA" in the last 168 hours. Coagulation Profile: No results for input(s): "INR", "PROTIME" in the last 168 hours. Cardiac Enzymes: No results for input(s): "CKTOTAL", "CKMB", "CKMBINDEX", "TROPONINI" in the last 168 hours. BNP (last 3 results) No results for input(s): "PROBNP" in the last 8760 hours. HbA1C: No results for input(s): "HGBA1C" in the last 72 hours. CBG: No results for input(s): "GLUCAP" in the last 168 hours. Lipid Profile: Recent Labs    08/27/23 0551  TRIG 237*   Thyroid Function Tests: No results for input(s): "TSH", "T4TOTAL", "FREET4", "T3FREE", "THYROIDAB" in the last 72 hours. Anemia Panel: No results for input(s): "VITAMINB12", "FOLATE", "FERRITIN", "TIBC", "IRON", "RETICCTPCT" in the last 72 hours. Sepsis Labs: Recent Labs  Lab 08/26/23 1145  LATICACIDVEN 0.8    No results found for this or any previous visit (from the past 240 hours).       Radiology Studies: US Abdomen Limited RUQ  (LIVER/GB) Result Date: 08/26/2023 CLINICAL DATA:  Right upper quadrant pain for 4 days. EXAM: ULTRASOUND ABDOMEN LIMITED RIGHT UPPER QUADRANT COMPARISON:  08/26/2023. FINDINGS: Gallbladder: Stones are present within the gallbladder. There is borderline gallbladder wall thickening at 4 mm. No sonographic Murphy sign noted by sonographer. Common bile duct: Diameter: 5.5 mm Liver: Hypodensity is noted at the gallbladder fossa, possible focal fatty sparing. Increased parenchymal echogenicity. Portal vein is patent on color Doppler imaging with normal direction of blood flow towards the liver. Other: None. IMPRESSION: 1. Cholelithiasis without acute cholecystitis. 2. Hepatic steatosis with focal fatty sparing at the gallbladder fossa. Electronically Signed   By: Thornell Sartorius M.D.   On: 08/26/2023 18:43   CT ABDOMEN PELVIS W CONTRAST Result Date: 08/26/2023 CLINICAL DATA:  Abdominal pain. EXAM: CT ABDOMEN AND PELVIS WITH CONTRAST TECHNIQUE: Multidetector CT imaging of the abdomen and pelvis was performed using the standard protocol following bolus administration of intravenous contrast. RADIATION DOSE REDUCTION: This exam was performed according to the departmental dose-optimization program which includes automated exposure control, adjustment of the mA and/or kV according to patient size and/or  use of iterative reconstruction technique. CONTRAST:  OMNIPAQUE IOHEXOL 300 MG/ML  SOLN COMPARISON:  None Available. FINDINGS: Lower chest: The visualized lung bases are clear. No intra-abdominal free air or free fluid. Hepatobiliary: The liver is unremarkable. The retention. Gallstone. No pericholecystic fluid or evidence of acute cholecystitis by CT. Pancreas: There is inflammatory changes of the uncinate processes pancreas consistent with acute pancreatitis. Correlation with pancreatic enzymes recommended. No drainable fluid collection/abscess or pseudocyst. No dilatation of the main pancreatic duct. Spleen: Normal in  size without focal abnormality. Adrenals/Urinary Tract: The adrenal glands unremarkable. The kidneys, visualized ureters, and urinary bladder unremarkable. Stomach/Bowel: There is sigmoid diverticulosis. There is no bowel obstruction or active inflammation. There is a small hiatal hernia. The appendix is normal. Vascular/Lymphatic: Mild aortoiliac atherosclerotic disease. The IVC is unremarkable. No portal venous gas. There is no adenopathy. Reproductive: The uterus is anteverted. Small uterine fibroid. No suspicious adnexal masses. Other: None Musculoskeletal: No acute or significant osseous findings. IMPRESSION: 1. Acute pancreatitis. No drainable fluid collection/abscess or pseudocyst. 2. Cholelithiasis. 3. Sigmoid diverticulosis. No bowel obstruction. Normal appendix. Electronically Signed   By: Elgie Collard M.D.   On: 08/26/2023 15:06        Scheduled Meds:  bisacodyl  10 mg Rectal Once   fluticasone furoate-vilanterol  1 puff Inhalation Daily   [START ON 08/28/2023] levothyroxine  100 mcg Oral Q0600   Continuous Infusions:  lactated ringers 125 mL/hr at 08/27/23 1256     LOS: 0 days    Time spent: 50 minutes spent on 08/27/2023 caring for this patient face-to-face including chart review, ordering labs/tests, documenting, discussion with nursing staff, consultants, updating family and interview/physical exam    Alvira Philips [place], DO [facility] Available via Epic secure chat 7am-7pm After these hours, please refer to coverage provider listed on [email or web address] 08/27/2023, 4:57 PM

## 2023-08-27 NOTE — Plan of Care (Signed)

## 2023-08-27 NOTE — Plan of Care (Signed)

## 2023-08-28 DIAGNOSIS — K8500 Idiopathic acute pancreatitis without necrosis or infection: Secondary | ICD-10-CM | POA: Diagnosis not present

## 2023-08-28 DIAGNOSIS — K859 Acute pancreatitis without necrosis or infection, unspecified: Secondary | ICD-10-CM | POA: Diagnosis not present

## 2023-08-28 LAB — CBC
HCT: 35.2 % — ABNORMAL LOW (ref 36.0–46.0)
Hemoglobin: 11.5 g/dL — ABNORMAL LOW (ref 12.0–15.0)
MCH: 32.2 pg (ref 26.0–34.0)
MCHC: 32.7 g/dL (ref 30.0–36.0)
MCV: 98.6 fL (ref 80.0–100.0)
Platelets: 231 10*3/uL (ref 150–400)
RBC: 3.57 MIL/uL — ABNORMAL LOW (ref 3.87–5.11)
RDW: 13.1 % (ref 11.5–15.5)
WBC: 7.6 10*3/uL (ref 4.0–10.5)
nRBC: 0 % (ref 0.0–0.2)

## 2023-08-28 LAB — COMPREHENSIVE METABOLIC PANEL WITH GFR
ALT: 15 U/L (ref 0–44)
AST: 12 U/L — ABNORMAL LOW (ref 15–41)
Albumin: 3.1 g/dL — ABNORMAL LOW (ref 3.5–5.0)
Alkaline Phosphatase: 57 U/L (ref 38–126)
Anion gap: 10 (ref 5–15)
BUN: 11 mg/dL (ref 8–23)
CO2: 26 mmol/L (ref 22–32)
Calcium: 8.7 mg/dL — ABNORMAL LOW (ref 8.9–10.3)
Chloride: 100 mmol/L (ref 98–111)
Creatinine, Ser: 0.55 mg/dL (ref 0.44–1.00)
GFR, Estimated: >60 mL/min (ref >60)
Glucose, Bld: 72 mg/dL (ref 70–99)
Potassium: 3.8 mmol/L (ref 3.5–5.1)
Sodium: 136 mmol/L (ref 135–145)
Total Bilirubin: 0.4 mg/dL (ref 0.0–1.2)
Total Protein: 6.6 g/dL (ref 6.5–8.1)

## 2023-08-28 LAB — LIPASE, BLOOD: Lipase: 28 U/L (ref 11–51)

## 2023-08-28 NOTE — Discharge Summary (Signed)
 Physician Discharge Summary  Javaria Knapke ZOX:[id number] DOB: [birth year] DOA: 08/26/2023  PCP: Sheliah Hatch, MD  Admit date: 08/26/2023 Discharge date: 08/28/2023  Admitted From: Home Disposition: Home  Recommendations for Outpatient Follow-up:  Follow up with PCP in 1-2 weeks Follow-up with [facility] gastroenterology, Doug Sou, PA as scheduled on 10/18/2023 GI plans outpatient MRCP in 2 months once pancreatitis resolves  Home Health: No Equipment/Devices: None  Discharge Condition: Stable CODE STATUS: Full code Diet recommendation: Soft/bland diet  History of present illness:  Michele Chan is a 68 y.o. female with past medical history significant for hypothyroidism, asthma/bronchitis, HLD, GERD, osteoarthritis who presented to [facility] ED on 08/26/2023 with abdominal pain, nausea.  Ongoing for the last 3 days.  Localized to upper quadrants.  Denies any vomiting, no diarrhea.  Currently endorses minimal alcohol intake, 1 drink per week.  Also on meloxicam and Crestor.  No new medications.   In the ED, temperature 98.1 F, HR 77, RR 20, BP 121/81, SpO2 97% on room air.  WBC 12.4, hemoglobin 14.5, platelet count 288.  Sodium 134, potassium 4.1, chloride 98, CO2 25, glucose 111, BUN 16, creatinine 0.76.  AST 34, ALT 24, total bilirubin 1.2.  High sensitive troponin less than 2.  Lipase 34.  Urinalysis unrevealing.  CT abdomen/pelvis with contrast with acute pancreatitis without fluid collection/abscess or pseudocyst, cholelithiasis, sigmoid diverticulosis without bowel obstruction, normal appendix.  Right upper quadrant ultrasound with cholelithiasis without acute cholecystitis, hepatic steatosis with focal fatty sparing at the gallbladder fossa.  GI consulted.  Curies consulted for admission for further evaluation and management of acute pancreatitis.  Hospital course:  Acute pancreatitis Patient presenting to ED with 3-day history of progressive abdominal pain associate  with nausea.  Does endorse alcohol use but only 1 drink per week.  CT abdomen/pelvis with contrast with acute pancreatitis without fluid collection or abscess.  Right upper quadrant ultrasound notable for cholelithiasis without acute cholecystitis.  Patient was aborted with IV fluid hydration, diet was slowly advanced with toleration.  Seen by gastroenterology with recommendation of outpatient MRCP in 2 months once inflammation improves.  Has outpatient follow-up with GI scheduled on 10/18/2023.   Hypothyroidism Levothyroxine 100 mcg p.o. daily   Hyperlipidemia Resume statin on discharge   Osteoarthritis On meloxicam.   Obesity, class II Body mass index is 34.97 kg/m.  Discharge Diagnoses:  Principal Problem:   Acute pancreatitis Active Problems:   Hypothyroid   Hyperlipidemia   Gallstones   GERD (gastroesophageal reflux disease)    Discharge Instructions  Discharge Instructions     Call MD for:  difficulty breathing, headache or visual disturbances   Complete by: As directed    Call MD for:  extreme fatigue   Complete by: As directed    Call MD for:  persistant dizziness or light-headedness   Complete by: As directed    Call MD for:  persistant nausea and vomiting   Complete by: As directed    Call MD for:  severe uncontrolled pain   Complete by: As directed    Call MD for:  temperature >100.4   Complete by: As directed    Diet - low sodium heart healthy   Complete by: As directed    Increase activity slowly   Complete by: As directed       Allergies as of 08/28/2023   No Known Allergies      Medication List     TAKE these medications    acetaminophen 650 MG CR  tablet Commonly known as: TYLENOL Take 1,300 mg by mouth every 8 (eight) hours as needed for pain.   albuterol 108 (90 Base) MCG/ACT inhaler Commonly known as: VENTOLIN HFA Inhale 2 puffs into the lungs every 6 (six) hours as needed for wheezing or shortness of breath.   cetirizine 10 MG  tablet Commonly known as: ZYRTEC Take 10 mg by mouth in the morning.   cholecalciferol 25 MCG (1000 UNIT) tablet Commonly known as: VITAMIN D3 Take 1,000 Units by mouth in the morning.   fluticasone furoate-vilanterol 100-25 MCG/ACT Aepb Commonly known as: BREO ELLIPTA Inhale 1 puff into the lungs daily.   levothyroxine 100 MCG tablet Commonly known as: SYNTHROID TAKE 1 TABLET(100 MCG) BY MOUTH DAILY What changed: See the new instructions.   meloxicam 15 MG tablet Commonly known as: MOBIC Take 15 mg by mouth daily.   omeprazole 10 MG capsule Commonly known as: PRILOSEC Take 10 mg by mouth in the morning.   rosuvastatin 10 MG tablet Commonly known as: Crestor Take 1 tablet (10 mg total) by mouth daily. What changed: when to take this        Follow-up Information     Zehr, Princella Pellegrini, PA-C Follow up on 10/18/2023.   Specialty: Gastroenterology Why: 11:30 am Contact information: [address] [place] [address] [phone]         Sheliah Hatch, MD. Schedule an appointment as soon as possible for a visit in 1 week(s).   Specialty: Family Medicine Contact information: [address] [place] [address] [phone]                No Known Allergies  Consultations: [place] [facility]   Procedures/Studies: US Abdomen Limited RUQ (LIVER/GB) Result Date: 08/26/2023 CLINICAL DATA:  Right upper quadrant pain for 4 days. EXAM: ULTRASOUND ABDOMEN LIMITED RIGHT UPPER QUADRANT COMPARISON:  08/26/2023. FINDINGS: Gallbladder: Stones are present within the gallbladder. There is borderline gallbladder wall thickening at 4 mm. No sonographic Murphy sign noted by sonographer. Common bile duct: Diameter: 5.5 mm Liver: Hypodensity is noted at the gallbladder fossa, possible focal fatty sparing. Increased parenchymal echogenicity. Portal vein is patent on color Doppler imaging with normal direction of blood flow towards the liver. Other: None.  IMPRESSION: 1. Cholelithiasis without acute cholecystitis. 2. Hepatic steatosis with focal fatty sparing at the gallbladder fossa. Electronically Signed   By: Thornell Sartorius M.D.   On: 08/26/2023 18:43   CT ABDOMEN PELVIS W CONTRAST Result Date: 08/26/2023 CLINICAL DATA:  Abdominal pain. EXAM: CT ABDOMEN AND PELVIS WITH CONTRAST TECHNIQUE: Multidetector CT imaging of the abdomen and pelvis was performed using the standard protocol following bolus administration of intravenous contrast. RADIATION DOSE REDUCTION: This exam was performed according to the departmental dose-optimization program which includes automated exposure control, adjustment of the mA and/or kV according to patient size and/or use of iterative reconstruction technique. CONTRAST:  OMNIPAQUE IOHEXOL 300 MG/ML  SOLN COMPARISON:  None Available. FINDINGS: Lower chest: The visualized lung bases are clear. No intra-abdominal free air or free fluid. Hepatobiliary: The liver is unremarkable. The retention. Gallstone. No pericholecystic fluid or evidence of acute cholecystitis by CT. Pancreas: There is inflammatory changes of the uncinate processes pancreas consistent with acute pancreatitis. Correlation with pancreatic enzymes recommended. No drainable fluid collection/abscess or pseudocyst. No dilatation of the main pancreatic duct. Spleen: Normal in size without focal abnormality. Adrenals/Urinary Tract: The adrenal glands unremarkable. The kidneys, visualized ureters, and urinary bladder unremarkable. Stomach/Bowel: There is sigmoid  diverticulosis. There is no bowel obstruction or active inflammation. There is a small hiatal hernia. The appendix is normal. Vascular/Lymphatic: Mild aortoiliac atherosclerotic disease. The IVC is unremarkable. No portal venous gas. There is no adenopathy. Reproductive: The uterus is anteverted. Small uterine fibroid. No suspicious adnexal masses. Other: None Musculoskeletal: No acute or significant osseous findings.  IMPRESSION: 1. Acute pancreatitis. No drainable fluid collection/abscess or pseudocyst. 2. Cholelithiasis. 3. Sigmoid diverticulosis. No bowel obstruction. Normal appendix. Electronically Signed   By: Elgie Collard M.D.   On: 08/26/2023 15:06     Subjective: Patient seen examined bedside, lying in bed.  Tolerating soft diet without abdominal pain, no nausea/vomiting.  Ready for discharge home.  Discussed with GI with plans outpatient follow-up for MRCP in 2 months once inflammation improves.  Patient denies headache, no visual changes, no chest pain, no palpitations, no shortness of breath, no abdominal pain, no fever/chills/night sweats, no nausea/vomiting/diarrhea, no focal weakness, no fatigue, no paresthesias.  No acute events overnight per nursing staff.  Discharge Exam: Vitals:   08/28/23 0514 08/28/23 0903  BP: 126/68   Pulse: 63   Resp: 18   Temp: 97.8 F (36.6 C)   SpO2: 95% 96%   Vitals:   08/27/23 1139 08/27/23 1944 08/28/23 0514 08/28/23 0903  BP: (!) 143/75 136/71 126/68   Pulse: 70 79 63   Resp: 18 18 18    Temp: 98.8 F (37.1 C) 98.3 F (36.8 C) 97.8 F (36.6 C)   TempSrc:      SpO2: 98% 97% 95% 96%  Weight:      Height:        Physical Exam: GEN: NAD, alert and oriented x 3, obese HEENT: NCAT, PERRL, EOMI, sclera clear, MMM PULM: CTAB w/o wheezes/crackles, normal respiratory effort, on room air CV: RRR w/o M/G/R GI: abd soft, NTND, NABS, no R/G/M MSK: no peripheral edema, muscle strength globally intact 5/5 bilateral upper/lower extremities NEURO: CN II-XII intact, no focal deficits, sensation to light touch intact PSYCH: normal mood/affect Integumentary: dry/intact, no rashes or wounds    The results of significant diagnostics from this hospitalization (including imaging, microbiology, ancillary and laboratory) are listed below for reference.     Microbiology: No results found for this or any previous visit (from the past 240 hours).   Labs: BNP  (last 3 results) No results for input(s): "BNP" in the last 8760 hours. Basic Metabolic Panel: Recent Labs  Lab 08/26/23 1145 08/27/23 0551 08/28/23 0417  NA 134* 137 136  K 4.1 3.9 3.8  CL 98 100 100  CO2 25 25 26   GLUCOSE 111* 78 72  BUN 16 15 11   CREATININE 0.76 0.56 0.55  CALCIUM 9.5 9.1 8.7*   Liver Function Tests: Recent Labs  Lab 08/26/23 1145 08/27/23 0551 08/28/23 0417  AST 24 11* 12*  ALT 21 14 15   ALKPHOS 74 60 57  BILITOT 1.2 0.7 0.4  PROT 8.8* 7.0 6.6  ALBUMIN 4.2 3.3* 3.1*   Recent Labs  Lab 08/26/23 1145 08/28/23 0417  LIPASE 34 28   No results for input(s): "AMMONIA" in the last 168 hours. CBC: Recent Labs  Lab 08/26/23 1145 08/27/23 0551 08/28/23 0417  WBC 12.4* 10.1 7.6  NEUTROABS  --  7.2  --   HGB 14.5 12.4 11.5*  HCT 44.0 38.8 35.2*  MCV 96.7 99.2 98.6  PLT 288 249 231   Cardiac Enzymes: No results for input(s): "CKTOTAL", "CKMB", "CKMBINDEX", "TROPONINI" in the last 168 hours. BNP: Invalid input(s): "POCBNP"  CBG: No results for input(s): "GLUCAP" in the last 168 hours. D-Dimer No results for input(s): "DDIMER" in the last 72 hours. Hgb A1c No results for input(s): "HGBA1C" in the last 72 hours. Lipid Profile Recent Labs    08/27/23 0551  TRIG 237*   Thyroid function studies No results for input(s): "TSH", "T4TOTAL", "T3FREE", "THYROIDAB" in the last 72 hours.  Invalid input(s): "FREET3" Anemia work up No results for input(s): "VITAMINB12", "FOLATE", "FERRITIN", "TIBC", "IRON", "RETICCTPCT" in the last 72 hours. Urinalysis    Component Value Date/Time   COLORURINE YELLOW 08/26/2023 1626   APPEARANCEUR CLEAR 08/26/2023 1626   LABSPEC 1.028 08/26/2023 1626   PHURINE 5.0 08/26/2023 1626   GLUCOSEU NEGATIVE 08/26/2023 1626   HGBUR SMALL (A) 08/26/2023 1626   BILIRUBINUR NEGATIVE 08/26/2023 1626   KETONESUR 20 (A) 08/26/2023 1626   PROTEINUR NEGATIVE 08/26/2023 1626   NITRITE NEGATIVE 08/26/2023 1626   LEUKOCYTESUR  NEGATIVE 08/26/2023 1626   Sepsis Labs Recent Labs  Lab 08/26/23 1145 08/27/23 0551 08/28/23 0417  WBC 12.4* 10.1 7.6   Microbiology No results found for this or any previous visit (from the past 240 hours).   Time coordinating discharge: Over 30 minutes  SIGNED:   Alvira Philips [place], DO  [facility] 08/28/2023, 9:49 AM

## 2023-09-06 ENCOUNTER — Encounter: Payer: PPO | Admitting: Family Medicine

## 2023-09-19 DIAGNOSIS — H43813 Vitreous degeneration, bilateral: Secondary | ICD-10-CM | POA: Diagnosis not present

## 2023-09-27 ENCOUNTER — Encounter: Payer: Self-pay | Admitting: Family Medicine

## 2023-09-27 ENCOUNTER — Ambulatory Visit: Payer: PPO | Admitting: Family Medicine

## 2023-09-27 VITALS — BP 110/68 | HR 55 | Temp 98.0°F | Ht 68.0 in | Wt 214.0 lb

## 2023-09-27 DIAGNOSIS — E669 Obesity, unspecified: Secondary | ICD-10-CM | POA: Diagnosis not present

## 2023-09-27 DIAGNOSIS — Z0000 Encounter for general adult medical examination without abnormal findings: Secondary | ICD-10-CM

## 2023-09-27 DIAGNOSIS — E559 Vitamin D deficiency, unspecified: Secondary | ICD-10-CM

## 2023-09-27 LAB — HEPATIC FUNCTION PANEL
ALT: 16 U/L (ref 0–35)
AST: 18 U/L (ref 0–37)
Albumin: 4.5 g/dL (ref 3.5–5.2)
Alkaline Phosphatase: 65 U/L (ref 39–117)
Bilirubin, Direct: 0.1 mg/dL (ref 0.0–0.3)
Total Bilirubin: 0.8 mg/dL (ref 0.2–1.2)
Total Protein: 7.6 g/dL (ref 6.0–8.3)

## 2023-09-27 LAB — CBC WITH DIFFERENTIAL/PLATELET
Basophils Absolute: 0.1 10*3/uL (ref 0.0–0.1)
Basophils Relative: 1.4 % (ref 0.0–3.0)
Eosinophils Absolute: 0.3 10*3/uL (ref 0.0–0.7)
Eosinophils Relative: 5.8 % — ABNORMAL HIGH (ref 0.0–5.0)
HCT: 40.2 % (ref 36.0–46.0)
Hemoglobin: 13.6 g/dL (ref 12.0–15.0)
Lymphocytes Relative: 34.9 % (ref 12.0–46.0)
Lymphs Abs: 2 10*3/uL (ref 0.7–4.0)
MCHC: 33.8 g/dL (ref 30.0–36.0)
MCV: 95.2 fl (ref 78.0–100.0)
Monocytes Absolute: 0.5 10*3/uL (ref 0.1–1.0)
Monocytes Relative: 8.9 % (ref 3.0–12.0)
Neutro Abs: 2.8 10*3/uL (ref 1.4–7.7)
Neutrophils Relative %: 49 % (ref 43.0–77.0)
Platelets: 229 10*3/uL (ref 150.0–400.0)
RBC: 4.22 Mil/uL (ref 3.87–5.11)
RDW: 13.7 % (ref 11.5–15.5)
WBC: 5.7 10*3/uL (ref 4.0–10.5)

## 2023-09-27 LAB — LIPID PANEL
Cholesterol: 141 mg/dL (ref 0–200)
HDL: 60.5 mg/dL (ref >39.00)
LDL Cholesterol: 64 mg/dL (ref 0–99)
NonHDL: 80.83
Total CHOL/HDL Ratio: 2
Triglycerides: 84 mg/dL (ref 0.0–149.0)
VLDL: 16.8 mg/dL (ref 0.0–40.0)

## 2023-09-27 LAB — BASIC METABOLIC PANEL WITH GFR
BUN: 18 mg/dL (ref 6–23)
CO2: 26 meq/L (ref 19–32)
Calcium: 9.6 mg/dL (ref 8.4–10.5)
Chloride: 102 meq/L (ref 96–112)
Creatinine, Ser: 0.73 mg/dL (ref 0.40–1.20)
GFR: 85.27 mL/min (ref >60.00)
Glucose, Bld: 102 mg/dL — ABNORMAL HIGH (ref 70–99)
Potassium: 4.2 meq/L (ref 3.5–5.1)
Sodium: 137 meq/L (ref 135–145)

## 2023-09-27 LAB — VITAMIN D 25 HYDROXY (VIT D DEFICIENCY, FRACTURES): VITD: 26.67 ng/mL — ABNORMAL LOW (ref 30.00–100.00)

## 2023-09-27 LAB — TSH: TSH: 0.13 u[IU]/mL — ABNORMAL LOW (ref 0.35–5.50)

## 2023-09-27 NOTE — Assessment & Plan Note (Signed)
 Pt's PE WNL w/ exception of BMI.  Due for mammo- pt to schedule.  Declines repeat cologuard as GI may elect to do colonoscopy at upcoming June f/u.  Check labs.  Anticipatory guidance provided.

## 2023-09-27 NOTE — Assessment & Plan Note (Signed)
 Improved!  Pt is down 20 lbs since last visit.  Encouraged low carb diet and regular exercise.  Check labs to risk stratify.  Will follow.

## 2023-09-27 NOTE — Patient Instructions (Signed)
 Follow up in 6 months to recheck cholesterol We'll notify you of your lab results and make any changes if needed Continue to work on healthy diet and regular exercise- you can do it! Schedule your mammogram Call with any questions or concerns Stay Safe!  Stay Healthy! ENJOY YOUR TRIP!!

## 2023-09-27 NOTE — Progress Notes (Signed)
   Subjective:    Patient ID: Michele Chan, female    DOB: February 06, 1956, 68 y.o.   MRN: [id number]  HPI CPE- pt plans to make mammo appt.  Declines repeat cologuard.  Patient Care Team    Relationship Specialty Notifications Start End  Jess Morita, MD PCP - General Family Medicine  03/03/17     Health Maintenance  Topic Date Due   Medicare Annual Wellness (AWV)  Never done   Zoster Vaccines- Shingrix (1 of 2) Never done   MAMMOGRAM  07/11/2019   Fecal DNA (Cologuard)  07/03/2020   DEXA SCAN  Never done   COVID-19 Vaccine (4 - 2024-25 season) 01/21/2023   INFLUENZA VACCINE  12/21/2023   DTaP/Tdap/Td (2 - Td or Tdap) 06/20/2027   Pneumonia Vaccine 74+ Years old  Completed   Hepatitis C Screening  Completed   HPV VACCINES  Aged Out   Meningococcal B Vaccine  Aged Out      Review of Systems Patient reports no vision/hearing changes, adenopathy,fever, persistant/recurrent hoarseness , swallowing issues, chest pain, palpitations, edema, persistant/recurrent cough, hemoptysis, dyspnea (rest/exertional/paroxysmal nocturnal), gastrointestinal bleeding (melena, rectal bleeding), abdominal pain, significant heartburn, bowel changes, GU symptoms (dysuria, hematuria, incontinence), Gyn symptoms (abnormal  bleeding, pain),  syncope, focal weakness, memory loss, numbness & tingling, skin/hair/nail changes, abnormal bruising or bleeding, anxiety, or depression.   + 20 lb weight loss    Objective:   Physical Exam General Appearance:    Alert, cooperative, no distress, appears stated age  Head:    Normocephalic, without obvious abnormality, atraumatic  Eyes:    PERRL, conjunctiva/corneas clear, EOM's intact both eyes  Ears:    Normal TM's and external ear canals, both ears  Nose:   Nares normal, septum midline, mucosa normal, no drainage    or sinus tenderness  Throat:   Lips, mucosa, and tongue normal; teeth and gums normal  Neck:   Supple, symmetrical, trachea midline, no adenopathy;     Thyroid : no enlargement/tenderness/nodules  Back:     Symmetric, no curvature, ROM normal, no CVA tenderness  Lungs:     Clear to auscultation bilaterally, respirations unlabored  Chest Wall:    No tenderness or deformity   Heart:    Regular rate and rhythm, S1 and S2 normal, no murmur, rub   or gallop  Breast Exam:    Deferred to mammo  Abdomen:     Soft, non-tender, bowel sounds active all four quadrants,    no masses, no organomegaly  Genitalia:    Deferred  Rectal:    Extremities:   Extremities normal, atraumatic, no cyanosis or edema  Pulses:   2+ and symmetric all extremities  Skin:   Skin color, texture, turgor normal, no rashes or lesions  Lymph nodes:   Cervical, supraclavicular, and axillary nodes normal  Neurologic:   CNII-XII intact, normal strength, sensation and reflexes    throughout          Assessment & Plan:

## 2023-09-28 ENCOUNTER — Other Ambulatory Visit: Payer: Self-pay

## 2023-09-28 ENCOUNTER — Telehealth: Payer: Self-pay

## 2023-09-28 DIAGNOSIS — E785 Hyperlipidemia, unspecified: Secondary | ICD-10-CM

## 2023-09-28 MED ORDER — LEVOTHYROXINE SODIUM 88 MCG PO TABS: 88.0000 ug | ORAL_TABLET | Freq: Every day | ORAL | 3 refills | Status: DC

## 2023-09-28 MED ORDER — VITAMIN D (ERGOCALCIFEROL) 1.25 MG (50000 UNIT) PO CAPS: 50000.0000 [IU] | ORAL_CAPSULE | ORAL | 0 refills | Status: DC

## 2023-09-28 NOTE — Telephone Encounter (Signed)
 TSH lab has been  Vitamin D   Levothyroxine  to 88 has been sent  Pt has been notified  Lab visit has been made

## 2023-09-28 NOTE — Telephone Encounter (Signed)
-----   Message from Laymon Priest sent at 09/27/2023  5:53 PM EDT ----- Vit D is low.  Based on this, we need to start 50,000 units weekly x12 weeks in addition to daily OTC supplement of at least 2000 units.   Your TSH is mildly low- which means your Levothyroxine  dose is too high.  Based on this, we will decrease your Levothyroxine  to 88mcg daily and repeat your TSH at a lab only visit in 1 month (dx hypothyroid)  Remainder of labs are stable and look good!

## 2023-10-01 DIAGNOSIS — M1711 Unilateral primary osteoarthritis, right knee: Secondary | ICD-10-CM | POA: Diagnosis not present

## 2023-10-18 ENCOUNTER — Ambulatory Visit: Admitting: Gastroenterology

## 2023-10-30 ENCOUNTER — Encounter: Payer: Self-pay | Admitting: Gastroenterology

## 2023-10-30 ENCOUNTER — Ambulatory Visit: Admitting: Gastroenterology

## 2023-10-30 VITALS — BP 120/82 | HR 60 | Ht 68.5 in | Wt 205.4 lb

## 2023-10-30 DIAGNOSIS — K859 Acute pancreatitis without necrosis or infection, unspecified: Secondary | ICD-10-CM | POA: Diagnosis not present

## 2023-10-30 NOTE — Patient Instructions (Signed)
 You have been scheduled for an MRI at [facility] on 11/07/23. Your appointment time is 10 am. Please arrive to admitting (at main entrance of the hospital) 30 minutes prior to your appointment time for registration purposes. Please make certain not to have anything to eat or drink 6 hours prior to your test. In addition, if you have any metal in your body, have a pacemaker or defibrillator, please be sure to let your ordering physician know. This test typically takes 45 minutes to 1 hour to complete. Should you need to reschedule, please call [phone] to do so.  _______________________________________________________  If your blood pressure at your visit was 140/90 or greater, please contact your primary care physician to follow up on this.  _______________________________________________________  If you are age 35 or older, your body mass index should be between 23-30. Your Body mass index is 30.78 kg/m. If this is out of the aforementioned range listed, please consider follow up with your Primary Care Provider.  If you are age 19 or younger, your body mass index should be between 19-25. Your Body mass index is 30.78 kg/m. If this is out of the aformentioned range listed, please consider follow up with your Primary Care Provider.   ________________________________________________________  The [facility] providers would like to encourage you to use MYCHART to communicate with providers for non-urgent requests or questions.  Due to long hold times on the telephone, sending your provider a message by [facility] may be a faster and more efficient way to get a response.  Please allow 48 business hours for a response.  Please remember that this is for non-urgent requests.  _______________________________________________________

## 2023-10-30 NOTE — Progress Notes (Signed)
 10/30/2023 Michele Chan [id number] October 22, 1955   HISTORY OF PRESENT ILLNESS: This is a 68 year old female who is a patient of Dr. Allean Aran.  She was seen by our service back in early April for a bout of pancreatitis.  Never had an episode of pancreatitis previously.  Had 1 martini the day before the onset of her pain.  Drinks only about 1 alcoholic beverage/martini per week, minimal use.  Triglycerides 237.  Hepatic function panel normal.  CBC and BMP normal.  Lipase was normal at 34.   CT scan abdomen and pelvis with contrast:  Acute pancreatitis with gallstones.   Right upper quadrant ultrasound showed cholelithiasis without acute cholecystitis and hepatic steatosis with focal fat sparing at the gallbladder fossa.  Common bile duct 5.5 mm/no dilatation.   She improved well and was discharged.  This appointment was made for follow-up.  Plan was to perform MRI abdomen/MRCP to rule out any occult/subtle tumor.  She continues to feel well, no recurrence of pain or symptoms.  Has not had any alcohol.  Has been following a low-fat diet as well.  Is still taking the meloxicam.  Asking if she could try a very occasional martini.  Has a friend coming into town so is wanting to be able to have a drink with her.  Past Medical History:  Diagnosis Date   Allergy     Chronic bronchitis (HCC)    GERD (gastroesophageal reflux disease)    History of shingles    History of UTI    Thyroid  disease    Past Surgical History:  Procedure Laterality Date   TONSILLECTOMY AND ADENOIDECTOMY      reports that she has never smoked. She has never used smokeless tobacco. She reports current alcohol use of about 3.0 standard drinks of alcohol per week. She reports that she does not use drugs. family history includes Alzheimer's disease in her paternal grandmother; Asthma in her mother; Breast cancer (age of onset: 16) in her mother; Cancer in her maternal grandmother, mother, and paternal grandfather;  Diabetes in her paternal grandfather; Early death in her father; Emphysema in her father; Heart disease in her paternal grandfather; Hyperlipidemia in her brother and paternal grandfather; Hypertension in her mother; Miscarriages / Stillbirths in her mother; Parkinson's disease in her mother. No Known Allergies    Outpatient Encounter Medications as of 10/30/2023  Medication Sig   acetaminophen  (TYLENOL ) 650 MG CR tablet Take 1,300 mg by mouth every 8 (eight) hours as needed for pain.   albuterol  (VENTOLIN  HFA) 108 (90 Base) MCG/ACT inhaler Inhale 2 puffs into the lungs every 6 (six) hours as needed for wheezing or shortness of breath.   cetirizine (ZYRTEC) 10 MG tablet Take 10 mg by mouth in the morning.   cholecalciferol (VITAMIN D3) 25 MCG (1000 UNIT) tablet Take 1,000 Units by mouth in the morning.   fluticasone  furoate-vilanterol (BREO ELLIPTA ) 100-25 MCG/ACT AEPB Inhale 1 puff into the lungs daily.   levothyroxine  (SYNTHROID ) 88 MCG tablet Take 1 tablet (88 mcg total) by mouth daily.   meloxicam (MOBIC) 15 MG tablet Take 15 mg by mouth daily.   omeprazole (PRILOSEC) 10 MG capsule Take 10 mg by mouth in the morning.   rosuvastatin  (CRESTOR ) 10 MG tablet Take 1 tablet (10 mg total) by mouth daily. (Patient taking differently: Take 10 mg by mouth at bedtime.)   Vitamin D , Ergocalciferol , (DRISDOL ) 1.25 MG (50000 UNIT) CAPS capsule Take 1 capsule (50,000 Units total) by mouth every 7 (seven) days.  No facility-administered encounter medications on file as of 10/30/2023.     REVIEW OF SYSTEMS  : All other systems reviewed and negative except where noted in the History of Present Illness.   PHYSICAL EXAM: BP 120/82   Pulse 60   Ht 5' 8.5" (1.74 m)   Wt 205 lb 6.4 oz (93.2 kg)   BMI 30.78 kg/m  General: Well developed white female in no acute distress Head: Normocephalic and atraumatic Eyes:  Sclerae anicteric, conjunctiva pink. Ears: Normal auditory acuity Lungs: Clear throughout to  auscultation; no W/R/R. Heart: Regular rate and rhythm; no M/R/G. Musculoskeletal: Symmetrical with no gross deformities  Skin: No lesions on visible extremities Neurological: Alert oriented x 4, grossly non-focal Psychological:  Alert and cooperative. Normal mood and affect  ASSESSMENT AND PLAN: *Acute pancreatitis 08/2023: Seen by CT scan, but lipase normal.  LFTs normal.  Does have gallstones, but no biliary dilatation or sign of CBD stone.  Triglycerides slightly elevated.  Is on omeprazole and meloxicam daily.  Had one martini on 4/2, minimal ETOH use though ovearll, usually once a week with friends.  No clear cut explanation.    -Check MRI abdomen/MRCP to rule out any subtle tumor. -I think she is ok to test the very occasional social drink with friends.   CC:  Tabori, Katherine E, MD

## 2023-11-05 ENCOUNTER — Other Ambulatory Visit (INDEPENDENT_AMBULATORY_CARE_PROVIDER_SITE_OTHER)

## 2023-11-05 DIAGNOSIS — E785 Hyperlipidemia, unspecified: Secondary | ICD-10-CM

## 2023-11-05 LAB — TSH: TSH: 0.25 u[IU]/mL — ABNORMAL LOW (ref 0.35–5.50)

## 2023-11-06 ENCOUNTER — Other Ambulatory Visit: Payer: Self-pay

## 2023-11-06 ENCOUNTER — Ambulatory Visit: Payer: Self-pay | Admitting: Family Medicine

## 2023-11-06 MED ORDER — LEVOTHYROXINE SODIUM 75 MCG PO TABS: 75.0000 ug | ORAL_TABLET | Freq: Every day | ORAL | 3 refills | Status: DC

## 2023-11-06 NOTE — Progress Notes (Signed)
 Pt has reviewed labs via MyChart New dosage of levothyroxine  has been sent in to pharmacy on file.

## 2023-11-07 ENCOUNTER — Ambulatory Visit (HOSPITAL_COMMUNITY)
Admission: RE | Admit: 2023-11-07 | Discharge: 2023-11-07 | Disposition: A | Discharge status: Home / Self Care | Source: Ambulatory Visit | Attending: Gastroenterology | Admitting: Gastroenterology

## 2023-11-07 ENCOUNTER — Other Ambulatory Visit: Payer: Self-pay | Admitting: Gastroenterology

## 2023-11-07 ENCOUNTER — Encounter: Payer: Self-pay | Admitting: Gastroenterology

## 2023-11-07 DIAGNOSIS — K8020 Calculus of gallbladder without cholecystitis without obstruction: Secondary | ICD-10-CM | POA: Diagnosis not present

## 2023-11-07 DIAGNOSIS — K859 Acute pancreatitis without necrosis or infection, unspecified: Secondary | ICD-10-CM

## 2023-11-07 DIAGNOSIS — I700 Atherosclerosis of aorta: Secondary | ICD-10-CM | POA: Diagnosis not present

## 2023-11-07 MED ORDER — GADOBUTROL 1 MMOL/ML IV SOLN
9.0000 mL | Freq: Once | INTRAVENOUS | Status: AC | PRN
Start: 2023-11-07 — End: 2023-11-07
  Administered 2023-11-07: 9 mL via INTRAVENOUS

## 2023-11-12 ENCOUNTER — Telehealth: Payer: Self-pay | Admitting: Gastroenterology

## 2023-11-12 ENCOUNTER — Ambulatory Visit: Payer: Self-pay | Admitting: Gastroenterology

## 2023-11-12 NOTE — Telephone Encounter (Signed)
 Inbound call from patient returning phone call regarding results note. Please advise, thank you

## 2023-11-12 NOTE — Telephone Encounter (Signed)
 See alternate note

## 2023-11-13 ENCOUNTER — Other Ambulatory Visit: Payer: Self-pay | Admitting: Family Medicine

## 2023-11-13 DIAGNOSIS — E785 Hyperlipidemia, unspecified: Secondary | ICD-10-CM

## 2023-11-20 ENCOUNTER — Telehealth: Payer: Self-pay

## 2023-11-20 NOTE — Telephone Encounter (Signed)
 Please schedule this at time of call

## 2023-11-27 ENCOUNTER — Other Ambulatory Visit: Payer: Self-pay | Admitting: Family Medicine

## 2023-11-28 ENCOUNTER — Telehealth: Payer: Self-pay

## 2023-11-28 NOTE — Telephone Encounter (Signed)
 LVM

## 2023-11-30 DIAGNOSIS — H25013 Cortical age-related cataract, bilateral: Secondary | ICD-10-CM | POA: Diagnosis not present

## 2023-11-30 DIAGNOSIS — H2512 Age-related nuclear cataract, left eye: Secondary | ICD-10-CM | POA: Diagnosis not present

## 2023-11-30 DIAGNOSIS — H2513 Age-related nuclear cataract, bilateral: Secondary | ICD-10-CM | POA: Diagnosis not present

## 2023-11-30 DIAGNOSIS — H25043 Posterior subcapsular polar age-related cataract, bilateral: Secondary | ICD-10-CM | POA: Diagnosis not present

## 2023-11-30 DIAGNOSIS — H18413 Arcus senilis, bilateral: Secondary | ICD-10-CM | POA: Diagnosis not present

## 2023-12-06 DIAGNOSIS — K851 Biliary acute pancreatitis without necrosis or infection: Secondary | ICD-10-CM | POA: Diagnosis not present

## 2023-12-06 DIAGNOSIS — K8010 Calculus of gallbladder with chronic cholecystitis without obstruction: Secondary | ICD-10-CM | POA: Diagnosis not present

## 2023-12-14 ENCOUNTER — Ambulatory Visit: Payer: Self-pay | Admitting: General Surgery

## 2023-12-19 DIAGNOSIS — H2512 Age-related nuclear cataract, left eye: Secondary | ICD-10-CM | POA: Diagnosis not present

## 2023-12-20 DIAGNOSIS — H2511 Age-related nuclear cataract, right eye: Secondary | ICD-10-CM | POA: Diagnosis not present

## 2023-12-27 NOTE — Progress Notes (Addendum)
  Date of COVID positive in last 90 days: No  PCP - Comer Greet, MD Cardiologist - N/A  Chest x-ray - N/A EKG - 08-26-23 Epic Stress Test - N/A ECHO - N/A Cardiac Cath - N/A Pacemaker/ICD device last checked:  N/A Spinal Cord Stimulator: N/A  Bowel Prep - N/A  Sleep Study - N/A CPAP -   Fasting Blood Sugar - N/A Checks Blood Sugar _____ times a day  Last dose of GLP1 agonist-  N/A GLP1 instructions:  Do not take after     Last dose of SGLT-2 inhibitors-  N/A SGLT-2 instructions:  Do not take after    Blood Thinner Instructions: N/A Aspirin Instructions: Last Dose:  Activity level:  Can go up a flight of stairs and perform activities of daily living without stopping and without symptoms of chest pain or shortness of breath.  Anesthesia review: N/A  Patient denies shortness of breath, fever, cough and chest pain at PAT appointment  Patient verbalized understanding of instructions that were given to them at the PAT appointment. Patient was also instructed that they will need to review over the PAT instructions again at home before surgery.

## 2023-12-27 NOTE — Patient Instructions (Addendum)
 SURGICAL WAITING ROOM VISITATION Patients having surgery or a procedure may have no more than 2 support people in the waiting area - these visitors may rotate.    Children under the age of 58 must have an adult with them who is not the patient.  If the patient needs to stay at the hospital during part of their recovery, the visitor guidelines for inpatient rooms apply. Pre-op nurse will coordinate an appropriate time for 1 support person to accompany patient in pre-op.  This support person may not rotate.    Please refer to the [facility] website for the visitor guidelines for Inpatients (after your surgery is over and you are in a regular room).       Your procedure is scheduled on: 01-11-24   Report to [facility] Main Entrance    Report to admitting at 9:30 AM   Call this number if you have problems the morning of surgery [phone]   Do not eat food :After Midnight.   After Midnight you may have the following liquids until 8:45 AM DAY OF SURGERY  Water  Non-Citrus Juices (without pulp, NO RED-Apple, White grape, White cranberry) Black Coffee (NO MILK/CREAM OR CREAMERS, sugar ok)  Clear Tea (NO MILK/CREAM OR CREAMERS, sugar ok) regular and decaf                             Plain Jell-O (NO RED)                                           Fruit ices (not with fruit pulp, NO RED)                                     Popsicles (NO RED)                                                               Sports drinks like Gatorade (NO RED)                     If you have questions, please contact your surgeon's office.  FOLLOW ANY ADDITIONAL PRE OP INSTRUCTIONS YOU RECEIVED FROM YOUR SURGEON'S OFFICE!!!     Oral Hygiene is also important to reduce your risk of infection.                                    Remember - BRUSH YOUR TEETH THE MORNING OF SURGERY WITH YOUR REGULAR TOOTHPASTE   Do NOT smoke after Midnight   Take these medicines the morning of surgery with A SIP OF  WATER :    Levothyroxine    Omeprazole   Rosuvastatin    Zyrtec   Okay to use inhalers, eyedrops   Tylenol  if needed  Stop all vitamins and herbal supplements 7 days before surgery  You may not have any metal on your body including hair pins, jewelry, and body piercing             Do not wear make-up, lotions, powders, perfumes, or deodorant  Do not wear nail polish including gel and S&S, artificial/acrylic nails, or any other type of covering on natural nails including finger and toenails. If you have artificial nails, gel coating, etc. that needs to be removed by a nail salon please have this removed prior to surgery or surgery may need to be canceled/ delayed if the surgeon/ anesthesia feels like they are unable to be safely monitored.   Do not shave  48 hours prior to surgery.    Do not bring valuables to the hospital. [facility] IS NOT RESPONSIBLE   FOR VALUABLES.   Contacts, dentures or bridgework may not be worn into surgery.  DO NOT BRING YOUR HOME MEDICATIONS TO THE HOSPITAL. PHARMACY WILL DISPENSE MEDICATIONS LISTED ON YOUR MEDICATION LIST TO YOU DURING YOUR ADMISSION IN THE HOSPITAL!    Patients discharged on the day of surgery will not be allowed to drive home.  Someone NEEDS to stay with you for the first 24 hours after anesthesia.   Special Instructions: Bring a copy of your healthcare power of attorney and living will documents the day of surgery if you haven't scanned them before.              Please read over the following fact sheets you were given: IF YOU HAVE QUESTIONS ABOUT YOUR PRE-OP INSTRUCTIONS PLEASE CALL [phone] Gwen  If you received a COVID test during your pre-op visit  it is requested that you wear a mask when out in public, stay away from anyone that may not be feeling well and notify your surgeon if you develop symptoms. If you test positive for Covid or have been in contact with anyone that has tested positive in the last  10 days please notify you surgeon.   - Preparing for Surgery Before surgery, you can play an important role.  Because skin is not sterile, your skin needs to be as free of germs as possible.  You can reduce the number of germs on your skin by washing with CHG (chlorahexidine gluconate) soap before surgery.  CHG is an antiseptic cleaner which kills germs and bonds with the skin to continue killing germs even after washing. Please DO NOT use if you have an allergy  to CHG or antibacterial soaps.  If your skin becomes reddened/irritated stop using the CHG and inform your nurse when you arrive at Short Stay. Do not shave (including legs and underarms) for at least 48 hours prior to the first CHG shower.  You may shave your face/neck.  Please follow these instructions carefully:  1.  Shower with CHG Soap the night before surgery and the  morning of surgery.  2.  If you choose to wash your hair, wash your hair first as usual with your normal  shampoo.  3.  After you shampoo, rinse your hair and body thoroughly to remove the shampoo.                             4.  Use CHG as you would any other liquid soap.  You can apply chg directly to the skin and wash.  Gently with a scrungie or clean washcloth.  5.  Apply the CHG Soap to your body ONLY FROM THE  NECK DOWN.   Do   not use on face/ open                           Wound or open sores. Avoid contact with eyes, ears mouth and   genitals (private parts).                       Wash face,  Genitals (private parts) with your normal soap.             6.  Wash thoroughly, paying special attention to the area where your    surgery  will be performed.  7.  Thoroughly rinse your body with warm water  from the neck down.  8.  DO NOT shower/wash with your normal soap after using and rinsing off the CHG Soap.                9.  Pat yourself dry with a clean towel.            10.  Wear clean pajamas.            11.  Place clean sheets on your bed the night  of your first shower and do not  sleep with pets. Day of Surgery : Do not apply any lotions/deodorants the morning of surgery.  Please wear clean clothes to the hospital/surgery center.  FAILURE TO FOLLOW THESE INSTRUCTIONS MAY RESULT IN THE CANCELLATION OF YOUR SURGERY  PATIENT SIGNATURE_________________________________  NURSE SIGNATURE__________________________________  ________________________________________________________________________

## 2023-12-31 DIAGNOSIS — M1711 Unilateral primary osteoarthritis, right knee: Secondary | ICD-10-CM | POA: Diagnosis not present

## 2024-01-01 ENCOUNTER — Other Ambulatory Visit: Payer: Self-pay

## 2024-01-01 ENCOUNTER — Encounter (HOSPITAL_COMMUNITY): Payer: Self-pay

## 2024-01-01 ENCOUNTER — Encounter (HOSPITAL_COMMUNITY)
Admission: RE | Admit: 2024-01-01 | Discharge: 2024-01-01 | Disposition: A | Discharge status: Home / Self Care | Source: Ambulatory Visit | Attending: General Surgery | Admitting: General Surgery

## 2024-01-01 VITALS — BP 147/88 | HR 62 | Temp 98.2°F | Resp 16 | Ht 68.0 in | Wt 209.8 lb

## 2024-01-01 DIAGNOSIS — Z01818 Encounter for other preprocedural examination: Secondary | ICD-10-CM

## 2024-01-01 DIAGNOSIS — Z01812 Encounter for preprocedural laboratory examination: Secondary | ICD-10-CM | POA: Diagnosis not present

## 2024-01-01 HISTORY — DX: Unspecified osteoarthritis, unspecified site: M19.90

## 2024-01-01 HISTORY — DX: Family history of other specified conditions: Z84.89

## 2024-01-01 HISTORY — DX: Hypothyroidism, unspecified: E03.9

## 2024-01-01 LAB — CBC
HCT: 39.6 % (ref 36.0–46.0)
Hemoglobin: 12.6 g/dL (ref 12.0–15.0)
MCH: 30.6 pg (ref 26.0–34.0)
MCHC: 31.8 g/dL (ref 30.0–36.0)
MCV: 96.1 fL (ref 80.0–100.0)
Platelets: 279 K/uL (ref 150–400)
RBC: 4.12 MIL/uL (ref 3.87–5.11)
RDW: 13.2 % (ref 11.5–15.5)
WBC: 12.5 K/uL — ABNORMAL HIGH (ref 4.0–10.5)
nRBC: 0 % (ref 0.0–0.2)

## 2024-01-02 ENCOUNTER — Telehealth: Payer: Self-pay | Admitting: Family Medicine

## 2024-01-02 NOTE — Telephone Encounter (Signed)
 Type of form received: Surgical Clearance - Rt Total Knee Arthroplasty  Additional comments: Pt scheduled on 8/18 - 40 minutes  Received by: Fax  Form should be Faxed/mailed to: (address/ fax #) [phone]  Is patient requesting call for pickup: N/A  Form placed:  Labeled & placed in provider bin  Attach charge sheet.  Provider will determine charge.  Individual made aware of 3-5 business day turn around? N/A

## 2024-01-02 NOTE — Telephone Encounter (Signed)
 Placed in folder

## 2024-01-07 ENCOUNTER — Ambulatory Visit (INDEPENDENT_AMBULATORY_CARE_PROVIDER_SITE_OTHER): Admitting: Family Medicine

## 2024-01-07 ENCOUNTER — Encounter: Payer: Self-pay | Admitting: Family Medicine

## 2024-01-07 VITALS — BP 128/68 | HR 56 | Temp 98.0°F | Ht 68.0 in | Wt 208.0 lb

## 2024-01-07 DIAGNOSIS — Z01818 Encounter for other preprocedural examination: Secondary | ICD-10-CM

## 2024-01-07 NOTE — Patient Instructions (Signed)
 Follow up as needed or as scheduled Keep up the good work on healthy diet and regular exercise- you look great! Call with any questions or concerns Stay Safe!  Stay Healthy! GOOD LUCK! I can't wait to see the polar bear pics!!!

## 2024-01-07 NOTE — Progress Notes (Signed)
 Subjective:    Michele Chan is a 68 y.o. female who presents to the office today for a preoperative consultation at the request of surgeon Dr Josefina who plans on performing R total knee on date TBD. This consultation is requested for the specific conditions prompting preoperative evaluation (i.e. because of potential affect on operative risk): age, BMI. Planned anesthesia: spinal. The patient has the following known anesthesia issues: none. Patients bleeding risk: no recent abnormal bleeding, no remote history of abnormal bleeding, and no use of Ca-channel blockers. Patient does not have objections to receiving blood products if needed.  The following portions of the patient's history were reviewed and updated as appropriate: allergies, current medications, past family history, past medical history, past social history, past surgical history, and problem list.  Review of Systems A comprehensive review of systems was negative.    Objective:    BP 128/68   Pulse (!) 56   Temp 98 F (36.7 C)   Ht 5' 8 (1.727 m)   Wt 208 lb (94.3 kg)   SpO2 98%   BMI 31.63 kg/m   General Appearance:    Alert, cooperative, no distress, appears stated age  Head:    Normocephalic, without obvious abnormality, atraumatic  Eyes:    PERRL, conjunctiva/corneas clear, EOM's intact both eyes  Ears:    Normal TM's and external ear canals, both ears  Nose:   Nares normal, septum midline, mucosa normal, no drainage    or sinus tenderness  Throat:   Lips, mucosa, and tongue normal; teeth and gums normal  Neck:   Supple, symmetrical, trachea midline, no adenopathy;    thyroid :  no enlargement/tenderness/nodules  Back:     Symmetric, no curvature, ROM normal, no CVA tenderness  Lungs:     Clear to auscultation bilaterally, respirations unlabored  Chest Wall:    No tenderness or deformity   Heart:    Regular rate and rhythm, S1 and S2 normal, no murmur, rub   or gallop  Breast Exam:    No tenderness, masses, or  nipple abnormality  Abdomen:     Soft, non-tender, bowel sounds active all four quadrants,    no masses, no organomegaly  Genitalia:    deferred  Rectal:    Extremities:   Extremities normal, atraumatic, no cyanosis or edema  Pulses:   2+ and symmetric all extremities  Skin:   Skin color, texture, turgor normal, no rashes or lesions  Lymph nodes:   Cervical, supraclavicular, and axillary nodes normal  Neurologic:   CNII-XII intact, normal strength, sensation and reflexes    throughout    Predictors of intubation difficulty:  Morbid obesity? no  Anatomically abnormal facies? no  Prominent incisors? no  Receding mandible? no  Short, thick neck? no  Neck range of motion: normal  Dentition: No chipped, loose, or missing teeth.  Cardiographics ECG: no change since previous ECG dated 2018 Echocardiogram: not done  Imaging Chest x-ray: NA   Lab Review  Per Surgical Center    Assessment:      68 y.o. female with planned surgery as above.   Known risk factors for perioperative complications: None   Difficulty with intubation is not anticipated.  Cardiac Risk Estimation: low  Current medications which may produce withdrawal symptoms if withheld perioperatively: none    Plan:    1. Preoperative workup as follows ECG. 2. Change in medication regimen before surgery: discontinue NSAIDs (Meloxicam) 14 days before surgery. 3. Prophylaxis for cardiac events with perioperative beta-blockers:  not indicated. 4. Invasive hemodynamic monitoring perioperatively: at the discretion of anesthesiologist. 5. Deep vein thrombosis prophylaxis postoperatively:regimen to be chosen by surgical team. 6. Surveillance for postoperative MI with ECG immediately postoperatively and on postoperative days 1 and 2 AND troponin levels 24 hours postoperatively and on day 4 or hospital discharge (whichever comes first): at the discretion of anesthesiologist. 7. Other measures: none

## 2024-01-08 NOTE — Telephone Encounter (Signed)
 Faxed 01/07/2024

## 2024-01-08 NOTE — Telephone Encounter (Signed)
 Form completed and given to Tonya to fax on 8/18

## 2024-01-11 ENCOUNTER — Other Ambulatory Visit: Payer: Self-pay

## 2024-01-11 ENCOUNTER — Encounter (HOSPITAL_COMMUNITY)
Admission: RE | Disposition: A | Discharge status: Home / Self Care | Payer: Self-pay | Source: Home / Self Care | Attending: General Surgery

## 2024-01-11 ENCOUNTER — Encounter (HOSPITAL_COMMUNITY): Payer: Self-pay | Admitting: General Surgery

## 2024-01-11 ENCOUNTER — Ambulatory Visit (HOSPITAL_COMMUNITY): Payer: Self-pay

## 2024-01-11 ENCOUNTER — Ambulatory Visit (HOSPITAL_COMMUNITY): Payer: Self-pay | Admitting: Medical

## 2024-01-11 ENCOUNTER — Ambulatory Visit (HOSPITAL_COMMUNITY)
Admission: RE | Admit: 2024-01-11 | Discharge: 2024-01-11 | Disposition: A | Discharge status: Home / Self Care | Attending: General Surgery | Admitting: General Surgery

## 2024-01-11 DIAGNOSIS — Z8719 Personal history of other diseases of the digestive system: Secondary | ICD-10-CM | POA: Diagnosis not present

## 2024-01-11 DIAGNOSIS — K8010 Calculus of gallbladder with chronic cholecystitis without obstruction: Secondary | ICD-10-CM | POA: Insufficient documentation

## 2024-01-11 DIAGNOSIS — E039 Hypothyroidism, unspecified: Secondary | ICD-10-CM | POA: Insufficient documentation

## 2024-01-11 DIAGNOSIS — K219 Gastro-esophageal reflux disease without esophagitis: Secondary | ICD-10-CM | POA: Diagnosis not present

## 2024-01-11 DIAGNOSIS — K811 Chronic cholecystitis: Secondary | ICD-10-CM | POA: Diagnosis not present

## 2024-01-11 DIAGNOSIS — M1990 Unspecified osteoarthritis, unspecified site: Secondary | ICD-10-CM | POA: Insufficient documentation

## 2024-01-11 DIAGNOSIS — Z79899 Other long term (current) drug therapy: Secondary | ICD-10-CM | POA: Diagnosis not present

## 2024-01-11 DIAGNOSIS — K824 Cholesterolosis of gallbladder: Secondary | ICD-10-CM | POA: Diagnosis not present

## 2024-01-11 HISTORY — PX: CHOLECYSTECTOMY: SHX55

## 2024-01-11 SURGERY — LAPAROSCOPIC CHOLECYSTECTOMY
Anesthesia: General | Site: Abdomen

## 2024-01-11 MED ORDER — IBUPROFEN 800 MG PO TABS
800.0000 mg | ORAL_TABLET | Freq: Three times a day (TID) | ORAL | 0 refills | Status: AC | PRN
Start: 2024-01-11 — End: ?

## 2024-01-11 MED ORDER — OXYCODONE HCL 5 MG PO TABS
ORAL_TABLET | ORAL | Status: AC
  Filled 2024-01-11: qty 1

## 2024-01-11 MED ORDER — FENTANYL CITRATE PF 50 MCG/ML IJ SOSY
25.0000 ug | PREFILLED_SYRINGE | INTRAMUSCULAR | Status: DC | PRN
  Administered 2024-01-11 (×3): 50 ug via INTRAVENOUS

## 2024-01-11 MED ORDER — ROCURONIUM BROMIDE 100 MG/10ML IV SOLN
INTRAVENOUS | Status: DC | PRN
  Administered 2024-01-11: 10 mg via INTRAVENOUS
  Administered 2024-01-11: 60 mg via INTRAVENOUS

## 2024-01-11 MED ORDER — GABAPENTIN 300 MG PO CAPS
300.0000 mg | ORAL_CAPSULE | ORAL | Status: AC
  Administered 2024-01-11: 300 mg via ORAL
  Filled 2024-01-11: qty 1

## 2024-01-11 MED ORDER — LIDOCAINE HCL (PF) 2 % IJ SOLN
INTRAMUSCULAR | Status: AC
  Filled 2024-01-11: qty 5

## 2024-01-11 MED ORDER — CELECOXIB 200 MG PO CAPS
400.0000 mg | ORAL_CAPSULE | ORAL | Status: AC
  Administered 2024-01-11: 400 mg via ORAL
  Filled 2024-01-11: qty 2

## 2024-01-11 MED ORDER — FENTANYL CITRATE (PF) 250 MCG/5ML IJ SOLN
INTRAMUSCULAR | Status: AC
  Filled 2024-01-11: qty 5

## 2024-01-11 MED ORDER — LIDOCAINE HCL (CARDIAC) PF 100 MG/5ML IV SOSY
PREFILLED_SYRINGE | INTRAVENOUS | Status: DC | PRN
  Administered 2024-01-11: 40 mg via INTRAVENOUS

## 2024-01-11 MED ORDER — EPHEDRINE 5 MG/ML INJ
INTRAVENOUS | Status: AC
  Filled 2024-01-11: qty 5

## 2024-01-11 MED ORDER — DEXMEDETOMIDINE HCL IN NACL 80 MCG/20ML IV SOLN
INTRAVENOUS | Status: DC | PRN
Start: 2024-01-11 — End: 2024-01-11
  Administered 2024-01-11: 8 ug via INTRAVENOUS

## 2024-01-11 MED ORDER — OXYCODONE HCL 5 MG PO TABS
5.0000 mg | ORAL_TABLET | Freq: Once | ORAL | Status: AC | PRN
  Administered 2024-01-11: 5 mg via ORAL

## 2024-01-11 MED ORDER — ACETAMINOPHEN 10 MG/ML IV SOLN: 1000.0000 mg | Freq: Once | INTRAVENOUS | Status: DC | PRN

## 2024-01-11 MED ORDER — FENTANYL CITRATE PF 50 MCG/ML IJ SOSY
PREFILLED_SYRINGE | INTRAMUSCULAR | Status: AC
  Filled 2024-01-11: qty 2

## 2024-01-11 MED ORDER — EPHEDRINE SULFATE-NACL 50-0.9 MG/10ML-% IV SOSY
PREFILLED_SYRINGE | INTRAVENOUS | Status: DC | PRN
  Administered 2024-01-11: 5 mg via INTRAVENOUS

## 2024-01-11 MED ORDER — ONDANSETRON HCL 4 MG/2ML IJ SOLN
INTRAMUSCULAR | Status: AC
  Filled 2024-01-11: qty 2

## 2024-01-11 MED ORDER — CHLORHEXIDINE GLUCONATE CLOTH 2 % EX PADS: 6.0000 | MEDICATED_PAD | Freq: Once | CUTANEOUS | Status: DC

## 2024-01-11 MED ORDER — STERILE WATER FOR IRRIGATION IR SOLN
Status: DC | PRN
  Administered 2024-01-11: 1000 mL

## 2024-01-11 MED ORDER — ROCURONIUM BROMIDE 10 MG/ML (PF) SYRINGE
PREFILLED_SYRINGE | INTRAVENOUS | Status: AC
  Filled 2024-01-11: qty 10

## 2024-01-11 MED ORDER — DEXAMETHASONE SODIUM PHOSPHATE 10 MG/ML IJ SOLN
INTRAMUSCULAR | Status: DC | PRN
  Administered 2024-01-11: 5 mg via INTRAVENOUS

## 2024-01-11 MED ORDER — SUGAMMADEX SODIUM 200 MG/2ML IV SOLN
INTRAVENOUS | Status: DC | PRN
  Administered 2024-01-11: 200 mg via INTRAVENOUS

## 2024-01-11 MED ORDER — OXYCODONE HCL 5 MG PO TABS: 5.0000 mg | ORAL_TABLET | Freq: Four times a day (QID) | ORAL | 0 refills | Status: AC | PRN

## 2024-01-11 MED ORDER — BUPIVACAINE-EPINEPHRINE 0.25% -1:200000 IJ SOLN
INTRAMUSCULAR | Status: DC | PRN
  Administered 2024-01-11: 30 mL

## 2024-01-11 MED ORDER — OXYCODONE HCL 5 MG/5ML PO SOLN: 5.0000 mg | Freq: Once | ORAL | Status: AC | PRN

## 2024-01-11 MED ORDER — MIDAZOLAM HCL 2 MG/2ML IJ SOLN
INTRAMUSCULAR | Status: AC
  Filled 2024-01-11: qty 2

## 2024-01-11 MED ORDER — FENTANYL CITRATE PF 50 MCG/ML IJ SOSY
PREFILLED_SYRINGE | INTRAMUSCULAR | Status: AC
  Filled 2024-01-11: qty 1

## 2024-01-11 MED ORDER — PROPOFOL 10 MG/ML IV BOLUS
INTRAVENOUS | Status: DC | PRN
Start: 2024-01-11 — End: 2024-01-11
  Administered 2024-01-11: 30 mg via INTRAVENOUS
  Administered 2024-01-11: 120 mg via INTRAVENOUS
  Administered 2024-01-11: 50 mg via INTRAVENOUS

## 2024-01-11 MED ORDER — ONDANSETRON HCL 4 MG/2ML IJ SOLN
INTRAMUSCULAR | Status: DC | PRN
  Administered 2024-01-11: 4 mg via INTRAVENOUS

## 2024-01-11 MED ORDER — LACTATED RINGERS IR SOLN
Status: DC | PRN
  Administered 2024-01-11: 1

## 2024-01-11 MED ORDER — FENTANYL CITRATE (PF) 100 MCG/2ML IJ SOLN
INTRAMUSCULAR | Status: DC | PRN
  Administered 2024-01-11: 50 ug via INTRAVENOUS
  Administered 2024-01-11: 25 ug via INTRAVENOUS

## 2024-01-11 MED ORDER — CEFAZOLIN SODIUM-DEXTROSE 2-4 GM/100ML-% IV SOLN
2.0000 g | INTRAVENOUS | Status: AC
  Administered 2024-01-11: 2 g via INTRAVENOUS
  Filled 2024-01-11: qty 100

## 2024-01-11 MED ORDER — ACETAMINOPHEN 500 MG PO TABS
1000.0000 mg | ORAL_TABLET | ORAL | Status: AC
  Administered 2024-01-11: 1000 mg via ORAL
  Filled 2024-01-11: qty 2

## 2024-01-11 MED ORDER — LACTATED RINGERS IV SOLN: INTRAVENOUS | Status: DC

## 2024-01-11 MED ORDER — BUPIVACAINE-EPINEPHRINE (PF) 0.25% -1:200000 IJ SOLN
INTRAMUSCULAR | Status: AC
  Filled 2024-01-11: qty 30

## 2024-01-11 MED ORDER — ORAL CARE MOUTH RINSE: 15.0000 mL | Freq: Once | OROMUCOSAL | Status: AC

## 2024-01-11 MED ORDER — MIDAZOLAM HCL 5 MG/5ML IJ SOLN
INTRAMUSCULAR | Status: DC | PRN
  Administered 2024-01-11: 2 mg via INTRAVENOUS

## 2024-01-11 MED ORDER — CHLORHEXIDINE GLUCONATE 0.12 % MT SOLN
15.0000 mL | Freq: Once | OROMUCOSAL | Status: AC
  Administered 2024-01-11: 15 mL via OROMUCOSAL

## 2024-01-11 MED ORDER — 0.9 % SODIUM CHLORIDE (POUR BTL) OPTIME
TOPICAL | Status: DC | PRN
  Administered 2024-01-11: 1000 mL

## 2024-01-11 MED ORDER — SUGAMMADEX SODIUM 200 MG/2ML IV SOLN
INTRAVENOUS | Status: AC
  Filled 2024-01-11: qty 2

## 2024-01-11 MED ORDER — DEXAMETHASONE SODIUM PHOSPHATE 10 MG/ML IJ SOLN
INTRAMUSCULAR | Status: AC
  Filled 2024-01-11: qty 1

## 2024-01-11 SURGICAL SUPPLY — 39 items
BAG COUNTER SPONGE SURGICOUNT (BAG) IMPLANT
BENZOIN TINCTURE PRP APPL 2/3 (GAUZE/BANDAGES/DRESSINGS) IMPLANT
BNDG ADH 1X3 SHEER STRL LF (GAUZE/BANDAGES/DRESSINGS) IMPLANT
CABLE HIGH FREQUENCY MONO STRZ (ELECTRODE) ×1 IMPLANT
CHLORAPREP W/TINT 26 (MISCELLANEOUS) ×1 IMPLANT
CLIP APPLIE 5 13 M/L LIGAMAX5 (MISCELLANEOUS) IMPLANT
CLIP APPLIE ROT 10 11.4 M/L (STAPLE) IMPLANT
CLIP LIGATING HEM O LOK PURPLE (MISCELLANEOUS) IMPLANT
CLIP LIGATING HEMO O LOK GREEN (MISCELLANEOUS) ×1 IMPLANT
COVER MAYO STAND XLG (MISCELLANEOUS) ×1 IMPLANT
COVER SURGICAL LIGHT HANDLE (MISCELLANEOUS) ×1 IMPLANT
DRAIN CHANNEL 19F RND (DRAIN) IMPLANT
DRAPE C-ARM 42X120 X-RAY (DRAPES) IMPLANT
ELECT REM PT RETURN 15FT ADLT (MISCELLANEOUS) ×1 IMPLANT
ENDOLOOP SUT PDS II 0 18 (SUTURE) IMPLANT
EVACUATOR SILICONE 100CC (DRAIN) IMPLANT
GLOVE BIOGEL PI IND STRL 7.0 (GLOVE) ×1 IMPLANT
GLOVE SURG SS PI 7.0 STRL IVOR (GLOVE) ×1 IMPLANT
GOWN STRL REUS W/ TWL LRG LVL3 (GOWN DISPOSABLE) ×1 IMPLANT
GRASPER SUT TROCAR 14GX15 (MISCELLANEOUS) IMPLANT
IRRIGATION SUCT STRKRFLW 2 WTP (MISCELLANEOUS) ×1 IMPLANT
KIT BASIN OR (CUSTOM PROCEDURE TRAY) ×1 IMPLANT
KIT TURNOVER KIT A (KITS) ×1 IMPLANT
NDL HYPO 22X1.5 SAFETY MO (MISCELLANEOUS) ×1 IMPLANT
NEEDLE HYPO 22X1.5 SAFETY MO (MISCELLANEOUS) ×1 IMPLANT
POUCH RETRIEVAL ECOSAC 10 (ENDOMECHANICALS) ×1 IMPLANT
SCISSORS LAP 5X35 DISP (ENDOMECHANICALS) ×1 IMPLANT
SET CHOLANGIOGRAPH MIX (MISCELLANEOUS) IMPLANT
SET TUBE SMOKE EVAC HIGH FLOW (TUBING) ×1 IMPLANT
SLEEVE Z-THREAD 5X100MM (TROCAR) ×2 IMPLANT
SPIKE FLUID TRANSFER (MISCELLANEOUS) ×1 IMPLANT
STRIP CLOSURE SKIN 1/2X4 (GAUZE/BANDAGES/DRESSINGS) IMPLANT
SUT ETHILON 2 0 PS N (SUTURE) IMPLANT
SUT MNCRL AB 4-0 PS2 18 (SUTURE) ×1 IMPLANT
SUT VICRYL 0 UR6 27IN ABS (SUTURE) IMPLANT
TOWEL OR 17X26 10 PK STRL BLUE (TOWEL DISPOSABLE) ×1 IMPLANT
TRAY LAPAROSCOPIC (CUSTOM PROCEDURE TRAY) ×1 IMPLANT
TROCAR Z THREAD OPTICAL 12X100 (TROCAR) ×1 IMPLANT
TROCAR Z-THREAD OPTICAL 5X100M (TROCAR) ×1 IMPLANT

## 2024-01-11 NOTE — Anesthesia Postprocedure Evaluation (Signed)
 Anesthesia Post Note  Patient: Michele Chan  Procedure(s) Performed: LAPAROSCOPIC CHOLECYSTECTOMY (Abdomen)     Patient location during evaluation: PACU Anesthesia Type: General Level of consciousness: awake and alert Pain management: pain level controlled Vital Signs Assessment: post-procedure vital signs reviewed and stable Respiratory status: spontaneous breathing, nonlabored ventilation and respiratory function stable Cardiovascular status: blood pressure returned to baseline and stable Postop Assessment: no apparent nausea or vomiting Anesthetic complications: no   No notable events documented.  Last Vitals:  Vitals:   01/11/24 1430 01/11/24 1445  BP: (!) 143/77 (!) 154/74  Pulse: 62 (!) 57  Resp: 16 19  Temp:  (!) 36.3 C  SpO2: 96% 97%    Last Pain:  Vitals:   01/11/24 1445  TempSrc:   PainSc: 4                  Adaley Kiene

## 2024-01-11 NOTE — Anesthesia Procedure Notes (Addendum)
 Procedure Name: Intubation Date/Time: 01/11/2024 12:31 PM  Performed by: Belvie Valri NOVAK, CRNAPre-anesthesia Checklist: Patient identified, Emergency Drugs available, Suction available and Patient being monitored Patient Re-evaluated:Patient Re-evaluated prior to induction Oxygen Delivery Method: Circle System Utilized Preoxygenation: Pre-oxygenation with 100% oxygen Induction Type: IV induction Ventilation: Mask ventilation without difficulty Laryngoscope Size: Glidescope and 3 Grade View: Grade I Tube type: Oral Tube size: 7.0 mm Number of attempts: 1 Airway Equipment and Method: Stylet and Oral airway Placement Confirmation: ETT inserted through vocal cords under direct vision, positive ETCO2 and breath sounds checked- equal and bilateral Secured at: 22 cm Tube secured with: Tape Dental Injury: Teeth and Oropharynx as per pre-operative assessment  Comments: Initial DL with MAC 3 grade 4 view

## 2024-01-11 NOTE — Transfer of Care (Signed)
 Immediate Anesthesia Transfer of Care Note  Patient: Michele Chan  Procedure(s) Performed: LAPAROSCOPIC CHOLECYSTECTOMY (Abdomen)  Patient Location: PACU  Anesthesia Type:General  Level of Consciousness: awake  Airway & Oxygen Therapy: Patient Spontanous Breathing  Post-op Assessment: Report given to RN and Post -op Vital signs reviewed and stable  Post vital signs: Reviewed and stable  Last Vitals:  Vitals Value Taken Time  BP 156/73 01/11/24 13:46  Temp    Pulse 64 01/11/24 13:48  Resp 16 01/11/24 13:48  SpO2 95 % 01/11/24 13:48  Vitals shown include unfiled device data.  Last Pain:  Vitals:   01/11/24 1029  TempSrc:   PainSc: 0-No pain         Complications: No notable events documented.

## 2024-01-11 NOTE — Discharge Instructions (Signed)
 [facility] ______[facility] LAPAROSCOPIC SURGERY: POST OP INSTRUCTIONS Always review your discharge instruction sheet given to you by the facility where your surgery was performed. IF YOU HAVE DISABILITY OR FAMILY LEAVE FORMS, YOU MUST BRING THEM TO THE OFFICE FOR PROCESSING.   DO NOT GIVE THEM TO YOUR DOCTOR.  A prescription for pain medication may be given to you upon discharge.  Take your pain medication as prescribed, if needed.  If narcotic pain medicine is not needed, then you may take acetaminophen  (Tylenol ) or ibuprofen  (Advil ) as needed. Take your usually prescribed medications unless otherwise directed. If you need a refill on your pain medication, please contact your pharmacy.  They will contact our office to request authorization. Prescriptions will not be filled after 5pm or on week-ends. You should follow a light diet the first few days after arrival home, such as soup and crackers, etc.  Be sure to include lots of fluids daily. Most patients will experience some swelling and bruising in the area of the incisions.  Ice packs will help.  Swelling and bruising can take several days to resolve.  It is common to experience some constipation if taking pain medication after surgery.  Increasing fluid intake and taking a stool softener (such as Colace) will usually help or prevent this problem from occurring.  A mild laxative (Milk of Magnesia or Miralax) should be taken according to package instructions if there are no bowel movements after 48 hours. Unless discharge instructions indicate otherwise, you may remove your bandages 24-48 hours after surgery, and you may shower at that time.  You may have steri-strips (small skin tapes) in place directly over the incision.  These strips should be left on the skin for 7-10 days.  If your surgeon used skin glue on the incision, you may shower in 24 hours.  The glue will flake off over the next 2-3 weeks.  Any sutures or staples will be  removed at the office during your follow-up visit. ACTIVITIES:  You may resume regular (light) daily activities beginning the next day--such as daily self-care, walking, climbing stairs--gradually increasing activities as tolerated.  You may have sexual intercourse when it is comfortable.  Refrain from any heavy lifting or straining until approved by your doctor. You may drive when you are no longer taking prescription pain medication, you can comfortably wear a seatbelt, and you can safely maneuver your car and apply brakes. RETURN TO WORK:  __________________________________________________________ Michele Chan should see your doctor in the office for a follow-up appointment approximately 2-3 weeks after your surgery.  Make sure that you call for this appointment within a day or two after you arrive home to insure a convenient appointment time. OTHER INSTRUCTIONS: __________________________________________________________________________________________________________________________ __________________________________________________________________________________________________________________________ WHEN TO CALL YOUR DOCTOR: Fever over 101.0 Inability to urinate Continued bleeding from incision. Increased pain, redness, or drainage from the incision. Increasing abdominal pain  The clinic staff is available to answer your questions during regular business hours.  Please don't hesitate to call and ask to speak to one of the nurses for clinical concerns.  If you have a medical emergency, go to the nearest emergency room or call 911.  A surgeon from [facility] is always on call at the hospital. [address], [address], [place], [place]  [address] ? [address] [address], [place], [place]   [address] [phone] ? [phone] ? FAX [phone] Web site: [email or web address]

## 2024-01-11 NOTE — Anesthesia Preprocedure Evaluation (Signed)
 Anesthesia Evaluation  Patient identified by MRN, date of birth, ID band Patient awake    Reviewed: Allergy  & Precautions, NPO status , Patient's Chart, lab work & pertinent test results  History of Anesthesia Complications Negative for: history of anesthetic complications  Airway Mallampati: III  TM Distance: <3 FB Neck ROM: Full    Dental  (+) Teeth Intact, Dental Advisory Given   Pulmonary neg COPD   breath sounds clear to auscultation       Cardiovascular (-) hypertension(-) angina (-) CAD, (-) Past MI and (-) CHF (-) dysrhythmias  Rhythm:Regular     Neuro/Psych neg Seizures negative neurological ROS  negative psych ROS   GI/Hepatic ,GERD  Medicated and Controlled,,GALLSTONES   Endo/Other  Hypothyroidism    Renal/GU Lab Results      Component                Value               Date                      NA                       137                 09/27/2023                K                        4.2                 09/27/2023                CO2                      26                  09/27/2023                GLUCOSE                  102 (H)             09/27/2023                BUN                      18                  09/27/2023                CREATININE               0.73                09/27/2023                CALCIUM                   9.6                 09/27/2023                GFR                      85.27               09/27/2023  GFRNONAA                 >60                 08/28/2023                Musculoskeletal  (+) Arthritis ,    Abdominal   Peds  Hematology Lab Results      Component                Value               Date                      WBC                      12.5 (H)            01/01/2024                HGB                      12.6                01/01/2024                HCT                      39.6                01/01/2024                MCV                       96.1                01/01/2024                PLT                      279                 01/01/2024              Anesthesia Other Findings   Reproductive/Obstetrics                              Anesthesia Physical Anesthesia Plan  ASA: 2  Anesthesia Plan: General   Post-op Pain Management: Tylenol  PO (pre-op)*, Gabapentin  PO (pre-op)* and Celebrex  PO (pre-op)*   Induction: Intravenous  PONV Risk Score and Plan: 4 or greater and Ondansetron  and Dexamethasone   Airway Management Planned: Oral ETT  Additional Equipment: None  Intra-op Plan:   Post-operative Plan: Extubation in OR  Informed Consent: I have reviewed the patients History and Physical, chart, labs and discussed the procedure including the risks, benefits and alternatives for the proposed anesthesia with the patient or authorized representative who has indicated his/her understanding and acceptance.     Dental advisory given  Plan Discussed with: CRNA  Anesthesia Plan Comments:         Anesthesia Quick Evaluation

## 2024-01-11 NOTE — Op Note (Signed)
 PATIENT:  Michele Chan  68 y.o. female  PRE-OPERATIVE DIAGNOSIS:  GALLSTONES  POST-OPERATIVE DIAGNOSIS:  GALLSTONES  PROCEDURE:  Procedure(s): LAPAROSCOPIC CHOLECYSTECTOMY   SURGEON:  Donyel Castagnola, Herlene Righter, MD   ASSISTANT: Eva Barrier, M.D.  ANESTHESIA:   local and general  Indications for procedure: [name] is a 68 y.o. female with symptoms of Abdominal pain consistent with gallbladder disease, Confirmed by ultrasound.  Description of procedure: The patient was brought into the operative suite, placed supine. Anesthesia was administered with endotracheal tube. Patient was strapped in place and foot board was secured. All pressure points were offloaded by foam padding. The patient was prepped and draped in the usual sterile fashion.  A periumbilical incision was made and optical entry was used to enter the abdomen. 2 5 mm trocars were placed on in the right lateral space on in the right subcostal space. A 12mm trocar was placed in the subxiphoid space. Marcaine  was infused to the subxiphoid space and lateral upper right abdomen in the transversus abdominis plane. Next the patient was placed in reverse trendelenberg. The gallbladder appearedchronically inflamed. Omentum was adhered to the gallbladder and was taken down with cautery/blunt dissection.  The gallbladder was retracted cephalad and lateral. The peritoneum was reflected off the infundibulum working lateral to medial. The cystic duct and cystic artery were identified and further dissection revealed a critical view. The cystic duct and cystic artery were doubly clipped and ligated.   The gallbladder was removed off the liver bed with cautery. The Gallbladder was placed in a specimen bag. The gallbladder fossa was irrigated and hemostasis was applied with cautery. The gallbladder was removed via the 12mm trocar. The fascial defect was closed with interrupted 0 vicryl suture via laparoscopic trans-fascial suture passer.  Pneumoperitoneum was removed, all trocar were removed. All incisions were closed with 4-0 monocryl subcuticular stitch. The patient woke from anesthesia and was brought to PACU in stable condition. All counts were correct  Findings: chronic inflammatory changes of the gallbladder  Specimen: gallbladder  Blood loss: 20 ml  Local anesthesia: 30 ml Marcaine   Complications: none  PLAN OF CARE: Discharge to home after PACU  PATIENT DISPOSITION:  PACU - hemodynamically stable.   Herlene Righter [facility], [place]

## 2024-01-11 NOTE — H&P (Signed)
 Chief Complaint  Patient presents with  New Consultation  cholelithiasis w/o cholecystitis   Subjective   Michele Chan is a 68 y.o. female new patient in today for: History of Present Illness Michele Chan is a 68 year old female with gallstones who presents with a history of pancreatitis.  She recently experienced an episode of pancreatitis with extreme pain, requiring urgent care and emergency room visits, but no hospital admission. An MRI conducted two to three weeks ago showed a pancreatic lesion, with a follow-up MRI planned in three months.  She follows a low-fat diet, avoids caffeine, and has significantly reduced alcohol intake, allowing herself one drink per week after abstaining for 60 to 90 days. Despite dietary adjustments, she occasionally experiences pain after consuming certain foods.  Social Drivers of Health with Concerns   Social Connections: Unknown (08/27/2023)  Received from [facility]  Social Connection and Isolation Panel  In a typical week, how many times do you talk on the phone with family, friends, or neighbors?: More than three times a week  How often do you get together with friends or relatives?: More than three times a week  How often do you attend church or religious services?: 1 to 4 times per year  Do you belong to any clubs or organizations such as church groups, unions, fraternal or athletic groups, or school groups?: Yes  Are you married, widowed, divorced, separated, never married, or living with a partner?: Patient declined  Housing Stability: Unknown (12/06/2023)  Housing Stability Vital Sign  Homeless in the Last Year: No    No data to display      Outpatient Medications Prior to Visit  Medication Sig Dispense Refill  acetaminophen  (TYLENOL ) 650 MG ER tablet Take 1,300 mg by mouth every 8 (eight) hours as needed for Pain  BREO ELLIPTA  100-25 mcg/dose DsDv inhaler Inhale 1 Puff into the lungs once daily  cetirizine (ZYRTEC) 10 MG tablet  Take 10 mg by mouth once daily  cholecalciferol (VITAMIN D3) 1000 unit tablet Take 1,000 Units by mouth every morning  ergocalciferol , vitamin D2, 1,250 mcg (50,000 unit) capsule Take 50,000 Units by mouth every 7 (seven) days  gatifloxacin (ZYMAXID) 0.5 % ophthalmic solution INSTILL 1 DROP IN LEFT EYE FOUR TIMES DAILY AS DIRECTED  ketorolac (ACULAR) 0.5 % ophthalmic solution INSTILL 1 DROP IN LEFT EYE FOUR TIMES DAILY AS DIRECTED  levothyroxine  (SYNTHROID ) 75 MCG tablet Take 75 mcg by mouth every morning before breakfast (0630)  meloxicam (MOBIC) 15 MG tablet TAKE 1 TABLET BY MOUTH DAILY WITH MEALS FOR 2 WEEKS THEN AS NEEDED  omeprazole (PRILOSEC) 10 MG DR capsule Take 10 mg by mouth once daily  prednisoLONE acetate (PRED FORTE) 1 % ophthalmic suspension INSTILL 1 DROP INTO LEFT EYE FOUR TIMES DAILY AS DIRECTED PLEASE SHAKE BEFORE USE  rosuvastatin  (CRESTOR ) 10 MG tablet Take 10 mg by mouth once daily   No facility-administered medications prior to visit.    Objective   Vitals:  12/06/23 1106 12/06/23 1107  BP: (!) 144/88  Pulse: 64  Temp: 36.4 C (97.5 F)  SpO2: 92%  Weight: 94.2 kg (207 lb 9.6 oz)  Height: 174 cm (5' 8.5)  PainSc: 0-No pain  PainLoc: Abdomen   Body mass index is 31.11 kg/m. Physical Exam Constitutional:  Appearance: Normal appearance.  HENT:  Head: Normocephalic and atraumatic.  Pulmonary:  Effort: Pulmonary effort is normal.  Abdominal:  Comments: Slight tenderness right upper quadrant   Musculoskeletal:  General: Normal range of motion.  Cervical back: Normal range of motion.   Neurological:  General: No focal deficit present.  Mental Status: She is alert and oriented to person, place, and time. Mental status is at baseline.   Psychiatric:  Mood and Affect: Mood normal.  Behavior: Behavior normal.  Thought Content: Thought content normal.    I reviewed MRI and US  images showing a large gallstone and small cyst in the head of the  pancreas.  Assessment/Plan:   Assessment & Plan Cholelithiasis with large gallstone 19 mm gallstone poses significant risk for complications such as acute pancreatitis. Surgical intervention recommended. - Schedule laparoscopic cholecystectomy. - We discussed the etiology of gallstones and probably cause pain. We discussed exacerbating factors including fatty meals. We discussed the details of surgery for removal of the gallbladder including general anesthesia, 4 small incisions in the patient's abdomen, removal of the patient's gallbladder with the liver and common bile duct, and most likely outpatient procedure. We discussed risks of common bile duct injury, cystic duct stump leak, injury to liver, bleeding, infection, need for open procedure, and post cholecystectomy syndrome. The patient showed good [place]

## 2024-01-12 ENCOUNTER — Encounter (HOSPITAL_COMMUNITY): Payer: Self-pay | Admitting: General Surgery

## 2024-01-14 LAB — SURGICAL PATHOLOGY

## 2024-01-23 DIAGNOSIS — H2511 Age-related nuclear cataract, right eye: Secondary | ICD-10-CM | POA: Diagnosis not present

## 2024-02-12 ENCOUNTER — Telehealth: Payer: Self-pay

## 2024-02-12 ENCOUNTER — Other Ambulatory Visit: Payer: Self-pay | Admitting: Family Medicine

## 2024-02-12 DIAGNOSIS — K859 Acute pancreatitis without necrosis or infection, unspecified: Secondary | ICD-10-CM

## 2024-02-12 NOTE — Telephone Encounter (Signed)
-----   Message from Nurse Emmitt Matthews P sent at 11/12/2023 10:16 AM EDT ----- repeat MRI/MRCP in 3 months

## 2024-02-12 NOTE — Telephone Encounter (Signed)
 The pt has been advised of the recommendation for MRI MRCP- order entered and sent to the schedulers

## 2024-02-22 ENCOUNTER — Other Ambulatory Visit: Payer: Self-pay | Admitting: Gastroenterology

## 2024-02-22 ENCOUNTER — Ambulatory Visit (HOSPITAL_COMMUNITY)
Admission: RE | Admit: 2024-02-22 | Discharge: 2024-02-22 | Disposition: A | Discharge status: Home / Self Care | Source: Ambulatory Visit | Attending: Gastroenterology | Admitting: Gastroenterology

## 2024-02-22 ENCOUNTER — Encounter (HOSPITAL_COMMUNITY): Payer: Self-pay | Admitting: Radiology

## 2024-02-22 DIAGNOSIS — K859 Acute pancreatitis without necrosis or infection, unspecified: Secondary | ICD-10-CM

## 2024-02-22 DIAGNOSIS — Z9049 Acquired absence of other specified parts of digestive tract: Secondary | ICD-10-CM | POA: Diagnosis not present

## 2024-02-22 MED ORDER — GADOBUTROL 1 MMOL/ML IV SOLN
9.0000 mL | Freq: Once | INTRAVENOUS | Status: AC | PRN
  Administered 2024-02-22: 9 mL via INTRAVENOUS

## 2024-02-27 ENCOUNTER — Ambulatory Visit: Payer: Self-pay | Admitting: Gastroenterology

## 2024-03-14 DIAGNOSIS — M25561 Pain in right knee: Secondary | ICD-10-CM | POA: Diagnosis not present

## 2024-03-14 DIAGNOSIS — M1711 Unilateral primary osteoarthritis, right knee: Secondary | ICD-10-CM | POA: Diagnosis not present

## 2024-04-02 ENCOUNTER — Ambulatory Visit: Payer: Self-pay | Admitting: Family Medicine

## 2024-04-02 ENCOUNTER — Ambulatory Visit: Admitting: Family Medicine

## 2024-04-02 VITALS — BP 120/60 | HR 74 | Temp 97.9°F | Wt 216.8 lb

## 2024-04-02 DIAGNOSIS — E785 Hyperlipidemia, unspecified: Secondary | ICD-10-CM | POA: Diagnosis not present

## 2024-04-02 DIAGNOSIS — E669 Obesity, unspecified: Secondary | ICD-10-CM | POA: Diagnosis not present

## 2024-04-02 DIAGNOSIS — R7309 Other abnormal glucose: Secondary | ICD-10-CM

## 2024-04-02 DIAGNOSIS — E039 Hypothyroidism, unspecified: Secondary | ICD-10-CM | POA: Diagnosis not present

## 2024-04-02 LAB — CBC WITH DIFFERENTIAL/PLATELET
Basophils Absolute: 0 K/uL (ref 0.0–0.1)
Basophils Relative: 0.8 % (ref 0.0–3.0)
Eosinophils Absolute: 0.3 K/uL (ref 0.0–0.7)
Eosinophils Relative: 4.1 % (ref 0.0–5.0)
HCT: 39.7 % (ref 36.0–46.0)
Hemoglobin: 13.3 g/dL (ref 12.0–15.0)
Lymphocytes Relative: 29.2 % (ref 12.0–46.0)
Lymphs Abs: 1.8 K/uL (ref 0.7–4.0)
MCHC: 33.6 g/dL (ref 30.0–36.0)
MCV: 93.7 fl (ref 78.0–100.0)
Monocytes Absolute: 0.5 K/uL (ref 0.1–1.0)
Monocytes Relative: 7.8 % (ref 3.0–12.0)
Neutro Abs: 3.6 K/uL (ref 1.4–7.7)
Neutrophils Relative %: 58.1 % (ref 43.0–77.0)
Platelets: 241 K/uL (ref 150.0–400.0)
RBC: 4.23 Mil/uL (ref 3.87–5.11)
RDW: 13.8 % (ref 11.5–15.5)
WBC: 6.2 K/uL (ref 4.0–10.5)

## 2024-04-02 LAB — BASIC METABOLIC PANEL WITH GFR
BUN: 17 mg/dL (ref 6–23)
CO2: 28 meq/L (ref 19–32)
Calcium: 9.4 mg/dL (ref 8.4–10.5)
Chloride: 102 meq/L (ref 96–112)
Creatinine, Ser: 0.68 mg/dL (ref 0.40–1.20)
GFR: 89.98 mL/min (ref >60.00)
Glucose, Bld: 127 mg/dL — ABNORMAL HIGH (ref 70–99)
Potassium: 3.8 meq/L (ref 3.5–5.1)
Sodium: 138 meq/L (ref 135–145)

## 2024-04-02 LAB — LIPID PANEL
Cholesterol: 146 mg/dL (ref 0–200)
HDL: 77 mg/dL (ref >39.00)
LDL Cholesterol: 55 mg/dL (ref 0–99)
NonHDL: 68.68
Total CHOL/HDL Ratio: 2
Triglycerides: 66 mg/dL (ref 0.0–149.0)
VLDL: 13.2 mg/dL (ref 0.0–40.0)

## 2024-04-02 LAB — HEPATIC FUNCTION PANEL
ALT: 16 U/L (ref 0–35)
AST: 19 U/L (ref 0–37)
Albumin: 4.4 g/dL (ref 3.5–5.2)
Alkaline Phosphatase: 87 U/L (ref 39–117)
Bilirubin, Direct: 0.1 mg/dL (ref 0.0–0.3)
Total Bilirubin: 0.6 mg/dL (ref 0.2–1.2)
Total Protein: 7.5 g/dL (ref 6.0–8.3)

## 2024-04-02 LAB — TSH: TSH: 1.27 u[IU]/mL (ref 0.35–5.50)

## 2024-04-02 NOTE — Patient Instructions (Signed)
 Schedule your complete physical in 6 months We'll notify you of your lab results and make any changes if needed Keep up the good work!  You look great! Call with any questions or concerns Stay Safe!  Stay Healthy! GOOD LUCK TOMORROW!

## 2024-04-02 NOTE — Progress Notes (Signed)
   Subjective:    Patient ID: Michele Chan, female    DOB: Jan 24, 1956, 68 y.o.   MRN: [id number]  HPI Hyperlipidemia- chronic problem, currently on Crestor  10mg  daily.  Denies CP, SOB, abd pain, N/V.  Hypothyroid- chronic problem.  Currently on Levothyroxine  75mcg daily.  Denies fatigue.  No changes to skin/hair/nails.  Obesity- pt is up 8 lbs since August.  BMI 32.96.  has knee surgery scheduled for tomorrow so exercise has been limited.   Review of Systems For ROS see HPI     Objective:   Physical Exam Vitals reviewed.  Constitutional:      General: She is not in acute distress.    Appearance: Normal appearance. She is well-developed. She is obese. She is not ill-appearing.  HENT:     Head: Normocephalic and atraumatic.  Eyes:     Conjunctiva/sclera: Conjunctivae normal.     Pupils: Pupils are equal, round, and reactive to light.  Neck:     Thyroid : No thyromegaly.  Cardiovascular:     Rate and Rhythm: Normal rate and regular rhythm.     Pulses: Normal pulses.     Heart sounds: Normal heart sounds. No murmur heard. Pulmonary:     Effort: Pulmonary effort is normal. No respiratory distress.     Breath sounds: Normal breath sounds.  Abdominal:     General: There is no distension.     Palpations: Abdomen is soft.     Tenderness: There is no abdominal tenderness.  Musculoskeletal:     Cervical back: Normal range of motion and neck supple.     Right lower leg: No edema.     Left lower leg: No edema.  Lymphadenopathy:     Cervical: No cervical adenopathy.  Skin:    General: Skin is warm and dry.  Neurological:     General: No focal deficit present.     Mental Status: She is alert and oriented to person, place, and time.  Psychiatric:        Mood and Affect: Mood normal.        Behavior: Behavior normal.        Thought Content: Thought content normal.           Assessment & Plan:

## 2024-04-03 ENCOUNTER — Ambulatory Visit: Payer: Self-pay | Admitting: Family Medicine

## 2024-04-03 ENCOUNTER — Other Ambulatory Visit (INDEPENDENT_AMBULATORY_CARE_PROVIDER_SITE_OTHER)

## 2024-04-03 DIAGNOSIS — G8918 Other acute postprocedural pain: Secondary | ICD-10-CM | POA: Diagnosis not present

## 2024-04-03 DIAGNOSIS — R7309 Other abnormal glucose: Secondary | ICD-10-CM

## 2024-04-03 DIAGNOSIS — M1711 Unilateral primary osteoarthritis, right knee: Secondary | ICD-10-CM | POA: Diagnosis not present

## 2024-04-03 LAB — HEMOGLOBIN A1C: Hgb A1c MFr Bld: 6.4 % (ref 4.6–6.5)

## 2024-04-03 NOTE — Telephone Encounter (Signed)
 A1C Future lab ordered   Lab add on form faxed   Patient viewed via mychart

## 2024-04-07 DIAGNOSIS — Z96651 Presence of right artificial knee joint: Secondary | ICD-10-CM | POA: Diagnosis not present

## 2024-04-16 DIAGNOSIS — Z96651 Presence of right artificial knee joint: Secondary | ICD-10-CM | POA: Diagnosis not present

## 2024-04-16 DIAGNOSIS — Z471 Aftercare following joint replacement surgery: Secondary | ICD-10-CM | POA: Diagnosis not present

## 2024-04-16 DIAGNOSIS — S76011A Strain of muscle, fascia and tendon of right hip, initial encounter: Secondary | ICD-10-CM | POA: Diagnosis not present

## 2024-04-20 NOTE — Assessment & Plan Note (Signed)
 Chronic problem.  Currently on Levothyroxine  75mcg daily and is asymptomatic.  Check labs.  Adjust meds prn

## 2024-04-20 NOTE — Assessment & Plan Note (Signed)
 Chronic problem.  On Crestor  10mg  daily w/o difficulty.  Check labs.  Adjust meds prn

## 2024-04-20 NOTE — Assessment & Plan Note (Signed)
 Pt has gained 8 lbs since August but she has not been able to exercise due to ongoing knee problems (has surgery tomorrow).  Encouraged her to follow a low carb diet and exercise as she is able.  Will follow.

## 2024-04-21 DIAGNOSIS — Z96651 Presence of right artificial knee joint: Secondary | ICD-10-CM | POA: Diagnosis not present

## 2024-04-23 DIAGNOSIS — Z96651 Presence of right artificial knee joint: Secondary | ICD-10-CM | POA: Diagnosis not present

## 2024-04-28 DIAGNOSIS — Z96651 Presence of right artificial knee joint: Secondary | ICD-10-CM | POA: Diagnosis not present

## 2024-04-30 DIAGNOSIS — Z96651 Presence of right artificial knee joint: Secondary | ICD-10-CM | POA: Diagnosis not present

## 2024-05-12 ENCOUNTER — Encounter: Payer: Self-pay | Admitting: Family Medicine

## 2024-05-12 MED ORDER — FLUTICASONE FUROATE-VILANTEROL 100-25 MCG/ACT IN AEPB: 1.0000 | INHALATION_SPRAY | Freq: Every day | RESPIRATORY_TRACT | 1 refills | Status: AC

## 2024-05-23 ENCOUNTER — Other Ambulatory Visit: Payer: Self-pay

## 2024-05-23 DIAGNOSIS — E785 Hyperlipidemia, unspecified: Secondary | ICD-10-CM

## 2024-05-23 MED ORDER — ROSUVASTATIN CALCIUM 10 MG PO TABS: 10.0000 mg | ORAL_TABLET | Freq: Every day | ORAL | 1 refills | Status: AC

## 2024-05-29 ENCOUNTER — Telehealth: Payer: Self-pay

## 2024-05-29 DIAGNOSIS — K859 Acute pancreatitis without necrosis or infection, unspecified: Secondary | ICD-10-CM

## 2024-05-29 NOTE — Telephone Encounter (Signed)
 Left message on machine to call back

## 2024-05-29 NOTE — Telephone Encounter (Signed)
-----   Message from Nurse Daphne BIRCH, RN sent at 02/27/2024  3:51 PM EDT ----- Pt needs repeat MRI at 3-6 months. See note from 10/8.

## 2024-05-29 NOTE — Telephone Encounter (Signed)
 Message left and message sent to the pt My Chart- she does view messages

## 2024-05-30 NOTE — Addendum Note (Signed)
 Addended by: ANITRA ODETTA CROME on: 05/30/2024 11:44 AM   Modules accepted: Orders

## 2024-06-16 ENCOUNTER — Ambulatory Visit (HOSPITAL_COMMUNITY)

## 2024-06-19 ENCOUNTER — Other Ambulatory Visit: Payer: Self-pay | Admitting: Gastroenterology

## 2024-06-19 ENCOUNTER — Ambulatory Visit (HOSPITAL_COMMUNITY)
Admission: RE | Admit: 2024-06-19 | Discharge: 2024-06-19 | Disposition: A | Discharge status: Home / Self Care | Source: Ambulatory Visit | Attending: Gastroenterology | Admitting: Gastroenterology

## 2024-06-19 DIAGNOSIS — K859 Acute pancreatitis without necrosis or infection, unspecified: Secondary | ICD-10-CM | POA: Insufficient documentation

## 2024-06-19 MED ORDER — GADOBUTROL 1 MMOL/ML IV SOLN
10.0000 mL | Freq: Once | INTRAVENOUS | Status: AC | PRN
  Administered 2024-06-19: 10 mL via INTRAVENOUS

## 2024-06-20 ENCOUNTER — Encounter: Payer: Self-pay | Admitting: Family Medicine

## 2024-06-20 MED ORDER — LEVOTHYROXINE SODIUM 75 MCG PO TABS: 75.0000 ug | ORAL_TABLET | Freq: Every day | ORAL | 3 refills | Status: AC

## 2024-06-24 ENCOUNTER — Ambulatory Visit: Payer: Self-pay | Admitting: Gastroenterology

## 2024-09-29 ENCOUNTER — Encounter: Admitting: Family Medicine

## 2024-10-14 ENCOUNTER — Encounter: Admitting: Family Medicine
